# Patient Record
Sex: Female | Born: 1995 | Race: Black or African American | Hispanic: No | Marital: Single
Health system: Midwestern US, Community
[De-identification: ages and names within clinical notes are randomized; demographics above are authoritative.]

## PROBLEM LIST (undated history)

## (undated) DIAGNOSIS — M940 Chondrocostal junction syndrome [Tietze]: Secondary | ICD-10-CM

## (undated) DIAGNOSIS — E0590 Thyrotoxicosis, unspecified without thyrotoxic crisis or storm: Secondary | ICD-10-CM

## (undated) HISTORY — DX: Thyrotoxicosis, unspecified without thyrotoxic crisis or storm: E05.90

## (undated) HISTORY — PX: APPENDECTOMY: SHX54

## (undated) HISTORY — DX: Chondrocostal junction syndrome (tietze): M94.0

---
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
## 2014-03-31 ENCOUNTER — Encounter: Payer: Self-pay | Admitting: Gynecology

## 2014-03-31 ENCOUNTER — Ambulatory Visit (INDEPENDENT_AMBULATORY_CARE_PROVIDER_SITE_OTHER): Payer: 59 | Admitting: Gynecology

## 2014-03-31 VITALS — BP 118/70

## 2014-03-31 DIAGNOSIS — N911 Secondary amenorrhea: Secondary | ICD-10-CM

## 2014-03-31 DIAGNOSIS — N912 Amenorrhea, unspecified: Secondary | ICD-10-CM

## 2014-03-31 DIAGNOSIS — N898 Other specified noninflammatory disorders of vagina: Secondary | ICD-10-CM

## 2014-03-31 DIAGNOSIS — Z113 Encounter for screening for infections with a predominantly sexual mode of transmission: Secondary | ICD-10-CM

## 2014-03-31 LAB — WET PREP FOR TRICH, YEAST, CLUE
Clue Cells Wet Prep HPF POC: NONE SEEN
Trich, Wet Prep: NONE SEEN
YEAST WET PREP: NONE SEEN

## 2014-03-31 LAB — PREGNANCY, URINE: Preg Test, Ur: NEGATIVE

## 2014-03-31 MED ORDER — NORGESTIMATE-ETH ESTRADIOL 0.25-35 MG-MCG PO TABS: 1.0000 | ORAL_TABLET | Freq: Every day | ORAL | Status: AC

## 2014-03-31 MED ORDER — MEDROXYPROGESTERONE ACETATE 10 MG PO TABS: ORAL_TABLET | ORAL | Status: DC

## 2014-03-31 MED ORDER — MEDROXYPROGESTERONE ACETATE 10 MG PO TABS: 10.0000 mg | ORAL_TABLET | Freq: Every day | ORAL | Status: DC

## 2014-03-31 MED ORDER — METRONIDAZOLE 500 MG PO TABS: 500.0000 mg | ORAL_TABLET | Freq: Two times a day (BID) | ORAL | Status: DC

## 2014-03-31 NOTE — Progress Notes (Signed)
   Patient is an 18 year old who presented to the office today with several complaints one being that she has not had a menstrual cycle since August. She is using condoms for contraception. Patient denies any nausea, vomiting, unusual headaches, or nipple discharge. Patient is a type I diabetic on insulin pump. She's been followed by her endocrinologist. Also patient is complaining of a slight discharge at times with no odor no pruritus. She did state that when she started school she dropped from 175 pounds 250 pounds within 2-3 week time frame. Patient had been on oral contraceptive pill in the past but it was a low dose and she experienced breakthrough bleeding and is interested in returning back to the oral contraceptive pill.  Exam: Abdomen: Soft nontender no rebound or guarding Pelvic exam: Bartholin urethra Skene was within normal limits Vagina: No lesions or discharge Cervix: No lesions or discharge (wet prep and GC Chlamydia culture obtained) Uterus: Anteverted normal size shape and consistency Adnexa: No palpable mass or tenderness Rectal exam: Deferred  Urine pregnancy test negative Pap rare WBCs few bacteria  Assessment/plan: Patient with secondary amenorrhea negative urine pregnancy test. We are going to check her TSH and prolactin today. She will be prescribed Provera 10 mg to take 1 by mouth daily for the next 5-10 days if the above tests are negative. When she does withdrawal on the second day she will begin taking Sprintec 28 day oral contraceptive pill. Risks benefits and pros and cons were discussed. She will also be prescribed Flagyl 500 mg take one by mouth twice a day for 7 days. GC and chlamydia culture pending at time of this dictation.

## 2014-03-31 NOTE — Patient Instructions (Addendum)
Secondary Amenorrhea  Secondary amenorrhea is the stopping of menstrual flow for 3 6 months in a female who has previously had periods. There are many possible causes. Most of these causes are not serious. Usually, treating the underlying problem causing the loss of menses will return your periods to normal. CAUSES  Some common and uncommon causes of not menstruating include:  Malnutrition.  Low blood sugar (hypoglycemia).  Polycystic ovary disease.  Stress or fear.  Breastfeeding.  Hormone imbalance.  Ovarian failure.  Medicines.  Extreme obesity.  Cystic fibrosis.  Low body weight or drastic weight reduction from any cause.  Early menopause.  Removal of ovaries or uterus.  Contraceptives.  Illness.  Long-term (chronic) illnesses.  Cushing syndrome.  Thyroid problems.  Birth control pills, patches, or vaginal rings for birth control. RISK FACTORS You may be at greater risk of secondary amenorrhea if:  You have a family history of this condition.  You have an eating disorder.  You do athletic training. DIAGNOSIS  A diagnosis is made by your health care provider taking a medical history and doing a physical exam. This will include a pelvic exam to check for problems with your reproductive organs. Pregnancy must be ruled out. Often, numerous blood tests are done to measure different hormones in the body. Urine testing may be done. Specialized exams (ultrasound, CT scan, MRI, or hysteroscopy) may have to be done as well as measuring the body mass index (BMI). TREATMENT  Treatment depends on the cause of the amenorrhea. If an eating disorder is present, this can be treated with an adequate diet and therapy. Chronic illnesses may improve with treatment of the illness. Amenorrhea may be corrected with medicines, lifestyle changes, or surgery. If the amenorrhea cannot be corrected, it is sometimes possible to create a false menstruation with medicines. HOME CARE  INSTRUCTIONS  Maintain a healthy diet.  Manage weight problems.  Exercise regularly but not excessively.  Get adequate sleep.  Manage stress.  Be aware of changes in your menstrual cycle. Keep a record of when your periods occur. Note the date your period starts, how long it lasts, and any problems. SEEK MEDICAL CARE IF: Your symptoms do not get better with treatment. Document Released: 11/27/2006 Document Revised: 06/18/2013 Document Reviewed: 04/03/2013 [facility] Patient Information 2014 [place], [place]. Oral Contraception Information Oral contraceptive pills (OCPs) are medicines taken to prevent pregnancy. OCPs work by preventing the ovaries from releasing eggs. The hormones in OCPs also cause the cervical mucus to thicken, preventing the sperm from entering the uterus. The hormones also cause the uterine lining to become thin, not allowing a fertilized egg to attach to the inside of the uterus. OCPs are highly effective when taken exactly as prescribed. However, OCPs do not prevent sexually transmitted diseases (STDs). Safe sex practices, such as using condoms along with the pill, can help prevent STDs.  Before taking the pill, you may have a physical exam and Pap test. Your health care provider may order blood tests. The health care provider will make sure you are a good candidate for oral contraception. Discuss with your health care provider the possible side effects of the OCP you may be prescribed. When starting an OCP, it can take 2 to 3 months for the body to adjust to the changes in hormone levels in your body.  TYPES OF ORAL CONTRACEPTION The combination pill This pill contains estrogen and progestin (synthetic progesterone) hormones. The combination pill comes in 21-day, 28-day, or 91-day packs. Some types of combination  pills are meant to be taken continuously (365-day pills). With 21-day packs, you do not take pills for 7 days after the last pill. With 28-day packs, the pill is  taken every day. The last 7 pills are without hormones. Certain types of pills have more than 21 hormone-containing pills. With 91-day packs, the first 84 pills contain both hormones, and the last 7 pills contain no hormones or contain estrogen only. The minipill This pill contains the progesterone hormone only. The pill is taken every day continuously. It is very important to take the pill at the same time each day. The minipill comes in packs of 28 pills. All 28 pills contain the hormone.  ADVANTAGES OF ORAL CONTRACEPTIVE PILLS Decreases premenstrual symptoms.  Treats menstrual period cramps.  Regulates the menstrual cycle.  Decreases a heavy menstrual flow.  May treatacne, depending on the type of pill.  Treats abnormal uterine bleeding.  Treats polycystic ovarian syndrome.  Treats endometriosis.  Can be used as emergency contraception.  THINGS THAT CAN MAKE ORAL CONTRACEPTIVE PILLS LESS EFFECTIVE OCPs can be less effective if:  You forget to take the pill at the same time every day.  You have a stomach or intestinal disease that lessens the absorption of the pill.  You take OCPs with other medicines that make OCPs less effective, such as antibiotics, certain HIV medicines, and some seizure medicines.  You take expired OCPs.  You forget to restart the pill on day 7, when using the packs of 21 pills.  RISKS ASSOCIATED WITH ORAL CONTRACEPTIVE PILLS  Oral contraceptive pills can sometimes cause side effects, such as: Headache. Nausea. Breast tenderness. Irregular bleeding or spotting. Combination pills are also associated with a small increased risk of: Blood clots. Heart attack. Stroke. Document Released: 01/06/2003 Document Revised: 08/06/2013 Document Reviewed: 04/06/2013 [facility] Patient Information 2014 [place], [place]. Bacterial Vaginosis Bacterial vaginosis is a vaginal infection that occurs when the normal balance of bacteria in the vagina is disrupted. It  results from an overgrowth of certain bacteria. This is the most common vaginal infection in women of childbearing age. Treatment is important to prevent complications, especially in pregnant women, as it can cause a premature delivery. CAUSES  Bacterial vaginosis is caused by an increase in harmful bacteria that are normally present in smaller amounts in the vagina. Several different kinds of bacteria can cause bacterial vaginosis. However, the reason that the condition develops is not fully understood. RISK FACTORS Certain activities or behaviors can put you at an increased risk of developing bacterial vaginosis, including: Having a new sex partner or multiple sex partners. Douching. Using an intrauterine device (IUD) for contraception. Women do not get bacterial vaginosis from toilet seats, bedding, swimming pools, or contact with objects around them. SIGNS AND SYMPTOMS  Some women with bacterial vaginosis have no signs or symptoms. Common symptoms include: Grey vaginal discharge. A fishlike odor with discharge, especially after sexual intercourse. Itching or burning of the vagina and vulva. Burning or pain with urination. DIAGNOSIS  Your health care provider will take a medical history and examine the vagina for signs of bacterial vaginosis. A sample of vaginal fluid may be taken. Your health care provider will look at this sample under a microscope to check for bacteria and abnormal cells. A vaginal pH test may also be done.  TREATMENT  Bacterial vaginosis may be treated with antibiotic medicines. These may be given in the form of a pill or a vaginal cream. A second round of antibiotics may be prescribed  if the condition comes back after treatment.  HOME CARE INSTRUCTIONS  Only take over-the-counter or prescription medicines as directed by your health care provider. If antibiotic medicine was prescribed, take it as directed. Make sure you finish it even if you start to feel better. Do not  have sex until treatment is completed. Tell all sexual partners that you have a vaginal infection. They should see their health care provider and be treated if they have problems, such as a mild rash or itching. Practice safe sex by using condoms and only having one sex partner. SEEK MEDICAL CARE IF:  Your symptoms are not improving after 3 days of treatment. You have increased discharge or pain. You have a fever. MAKE SURE YOU:  Understand these instructions. Will watch your condition. Will get help right away if you are not doing well or get worse. FOR MORE INFORMATION  Centers for Disease Control and Prevention, Division of STD Prevention: SolutionApps.co.za American Sexual Health Association (ASHA): www.ashastd.org  Document Released: 10/16/2005 Document Revised: 08/06/2013 Document Reviewed: 05/28/2013 [facility] Patient Information 2014 [place], [place].

## 2014-04-01 LAB — GC/CHLAMYDIA PROBE AMP
CT Probe RNA: NEGATIVE
GC Probe RNA: NEGATIVE

## 2014-04-06 ENCOUNTER — Other Ambulatory Visit: Payer: 59

## 2014-04-06 LAB — TSH: TSH: 3.168 u[IU]/mL (ref 0.350–4.500)

## 2014-04-07 LAB — PROLACTIN: Prolactin: 4.3 ng/mL

## 2014-04-22 ENCOUNTER — Telehealth: Payer: Self-pay | Admitting: *Deleted

## 2014-04-22 NOTE — Telephone Encounter (Signed)
Pt mother Clenton Paredele called asking if okay for pt to take ortho cyclen pills. Patient informed okay to take pill

## 2014-05-12 ENCOUNTER — Emergency Department (INDEPENDENT_AMBULATORY_CARE_PROVIDER_SITE_OTHER)
Admission: EM | Admit: 2014-05-12 | Discharge: 2014-05-12 | Disposition: A | Discharge status: Home / Self Care | Payer: 59 | Source: Home / Self Care | Attending: Family Medicine | Admitting: Family Medicine

## 2014-05-12 ENCOUNTER — Emergency Department (INDEPENDENT_AMBULATORY_CARE_PROVIDER_SITE_OTHER): Payer: 59

## 2014-05-12 ENCOUNTER — Telehealth: Payer: Self-pay | Admitting: *Deleted

## 2014-05-12 ENCOUNTER — Encounter (HOSPITAL_COMMUNITY): Payer: Self-pay | Admitting: Emergency Medicine

## 2014-05-12 DIAGNOSIS — K5900 Constipation, unspecified: Secondary | ICD-10-CM

## 2014-05-12 LAB — POCT URINALYSIS DIP (DEVICE)
Bilirubin Urine: NEGATIVE
Glucose, UA: >=1000 mg/dL — AB
Hgb urine dipstick: NEGATIVE
Ketones, ur: NEGATIVE mg/dL
LEUKOCYTES UA: NEGATIVE
Nitrite: NEGATIVE
PROTEIN: NEGATIVE mg/dL
SPECIFIC GRAVITY, URINE: 1.02 (ref 1.005–1.030)
Urobilinogen, UA: 0.2 mg/dL (ref 0.0–1.0)
pH: 6 (ref 5.0–8.0)

## 2014-05-12 LAB — GLUCOSE, CAPILLARY: Glucose-Capillary: 241 mg/dL — ABNORMAL HIGH (ref 70–99)

## 2014-05-12 LAB — POCT PREGNANCY, URINE: Preg Test, Ur: NEGATIVE

## 2014-05-12 MED ORDER — POLYETHYLENE GLYCOL 3350 17 G PO PACK: 17.0000 g | PACK | Freq: Every day | ORAL | Status: DC

## 2014-05-12 NOTE — ED Notes (Signed)
Reports a bloating feeling that started this am, c/o feeling the need to urinate, but little or no result with attempts to urinate.  History of uti

## 2014-05-12 NOTE — Discharge Instructions (Signed)
If symptoms become suddenly worse or severe, please seek re-evaluation at her nearest ER. Otherwise, follow up with your primary care doctor if no improvement over the next several days. Bloating Bloating is the feeling of fullness in your belly. You may feel as though your pants are too tight. Often the cause of bloating is overeating, retaining fluids, or having gas in your bowel. It is also caused by swallowing air and eating foods that cause gas. Irritable bowel syndrome is one of the most common causes of bloating. Constipation is also a common cause. Sometimes more serious problems can cause bloating. SYMPTOMS  Usually there is a feeling of fullness, as though your abdomen is bulged out. There may be mild discomfort.  DIAGNOSIS  Usually no particular testing is necessary for most bloating. If the condition persists and seems to become worse, your caregiver may do additional testing.  TREATMENT   There is no direct treatment for bloating.  Do not put gas into the bowel. Avoid chewing gum and sucking on candy. These tend to make you swallow air. Swallowing air can also be a nervous habit. Try to avoid this.  Avoiding high residue diets will help. Eat foods with soluble fibers (examples include root vegetables, apples, or barley) and substitute dairy products with soy and rice products. This helps irritable bowel syndrome.  If constipation is the cause, then a high residue diet with more fiber will help.  Avoid carbonated beverages.  Over-the-counter preparations are available that help reduce gas. Your pharmacist can help you with this. SEEK MEDICAL CARE IF:   Bloating continues and seems to be getting worse.  You notice a weight gain.  You have a weight loss but the bloating is getting worse.  You have changes in your bowel habits or develop nausea or vomiting. SEEK IMMEDIATE MEDICAL CARE IF:   You develop shortness of breath or swelling in your legs.  You have an increase in  abdominal pain or develop chest pain. Document Released: 08/16/2006 Document Revised: 01/08/2012 Document Reviewed: 10/04/2007 [facility] Patient Information 2015 [place], [place]. This information is not intended to replace advice given to you by your health care provider. Make sure you discuss any questions you have with your health care provider.  Constipation Constipation is when a person:  Poops (has a bowel movement) less than 3 times a week.  Has a hard time pooping.  Has poop that is dry, hard, or bigger than normal. HOME CARE   Eat foods with a lot of fiber in them. This includes fruits, vegetables, beans, and whole grains such as brown rice.  Avoid fatty foods and foods with a lot of sugar. This includes french fries, hamburgers, cookies, candy, and soda.  If you are not getting enough fiber from food, take products with added fiber in them (supplements).  Drink enough fluid to keep your pee (urine) clear or pale yellow.  Exercise on a regular basis, or as told by your doctor.  Go to the restroom when you feel like you need to poop. Do not hold it.  Only take medicine as told by your doctor. Do not take medicines that help you poop (laxatives) without talking to your doctor first. GET HELP RIGHT AWAY IF:   You have bright red blood in your poop (stool).  Your constipation lasts more than 4 days or gets worse.  You have belly (abdominal) or butt (rectal) pain.  You have thin poop (as thin as a pencil).  You lose weight, and  it cannot be explained. MAKE SURE YOU:   Understand these instructions.  Will watch your condition.  Will get help right away if you are not doing well or get worse. Document Released: 04/03/2008 Document Revised: 10/21/2013 Document Reviewed: 07/28/2013 [facility] Patient Information 2015 [place], [place]. This information is not intended to replace advice given to you by your health care provider. Make sure you discuss any questions you have with  your health care provider.  Fiber Content in Foods Drinking plenty of fluids and consuming foods high in fiber can help with constipation. See the list below for the fiber content of some common foods. Starches and Grains / Dietary Fiber (g)  Cheerios, 1 cup / 3 g  Kellogg's Corn Flakes, 1 cup / 0.7 g  Rice Krispies, 1  cup / 0.3 g  Quaker Oat Life Cereal,  cup / 2.1 g  Oatmeal, instant (cooked),  cup / 2 g  Kellogg's Frosted Mini Wheats, 1 cup / 5.1 g  Rice, brown, long-grain (cooked), 1 cup / 3.5 g  Rice, white, long-grain (cooked), 1 cup / 0.6 g  Macaroni, cooked, enriched, 1 cup / 2.5 g Legumes / Dietary Fiber (g)  Beans, baked, canned, plain or vegetarian,  cup / 5.2 g  Beans, kidney, canned,  cup / 6.8 g  Beans, pinto, dried (cooked),  cup / 7.7 g  Beans, pinto, canned,  cup / 5.5 g Breads and Crackers / Dietary Fiber (g)  Graham crackers, plain or honey, 2 squares / 0.7 g  Saltine crackers, 3 squares / 0.3 g  Pretzels, plain, salted, 10 pieces / 1.8 g  Bread, whole-wheat, 1 slice / 1.9 g  Bread, white, 1 slice / 0.7 g  Bread, raisin, 1 slice / 1.2 g  Bagel, plain, 3 oz / 2 g  Tortilla, flour, 1 oz / 0.9 g  Tortilla, corn, 1 small / 1.5 g  Bun, hamburger or hotdog, 1 small / 0.9 g Fruits / Dietary Fiber (g)  Apple, raw with skin, 1 medium / 4.4 g  Applesauce, sweetened,  cup / 1.5 g  Banana,  medium / 1.5 g  Grapes, 10 grapes / 0.4 g  Orange, 1 small / 2.3 g  Raisin, 1.5 oz / 1.6 g  Melon, 1 cup / 1.4 g Vegetables / Dietary Fiber (g)  Green beans, canned,  cup / 1.3 g  Carrots (cooked),  cup / 2.3 g  Broccoli (cooked),  cup / 2.8 g  Peas, frozen (cooked),  cup / 4.4 g  Potatoes, mashed,  cup / 1.6 g  Lettuce, 1 cup / 0.5 g  Corn, canned,  cup / 1.6 g  Tomato,  cup / 1.1 g Document Released: 03/04/2007 Document Revised: 01/08/2012 Document Reviewed: 04/29/2007 [employer] Patient Information 2015 [place], [place].  This information is not intended to replace advice given to you by your health care provider. Make sure you discuss any questions you have with your health care provider.

## 2014-05-12 NOTE — Telephone Encounter (Signed)
Pt called c/o abdominal pain, transferred to front desk.

## 2014-05-12 NOTE — ED Provider Notes (Signed)
CSN: [id number]     Arrival date & time 05/12/14  1031 History   First MD Initiated Contact with Patient 05/12/14 1155     Chief Complaint  Patient presents with  . Vaginal Pain   (Consider location/radiation/quality/duration/timing/severity/associated sxs/prior Treatment) HPI Comments: Patient states she woke this morning with a sense of abdominal bloating and cramping with feeling the need to urinate. State she has had urinary freqeuncy and urgency since but is only able to urinate in small amounts. Denies fever, chills, dysuria, hematuria, vaginal bleeding, vaginal discharge, flank pain, constipation, nausea, vomiting, or diarrhea. States symptoms wax and wane.  No previous abdominal surgeries Has been Type 1 diabetic since age 89. Wears insulin pump and reports CBGs to have been under good control over past several days.  LNMP: end of June 2015   The history is provided by the patient.    Past Medical History  Diagnosis Date  . Diabetes mellitus     since age 89  . Hyperthyroidism   . Costochondritis    Past Surgical History  Procedure Laterality Date  . Appendectomy     Family History  Problem Relation Age of Onset  . Diabetes Other     Grandparent  . Stroke Other     Grandparent  . Hypertension Other     Grandparent   History  Substance Use Topics  . Smoking status: Never Smoker   . Smokeless tobacco: Never Used  . Alcohol Use: No   OB History   Grav Para Term Preterm Abortions TAB SAB Ect Mult Living   0              Review of Systems  Constitutional: Negative for fever, chills, appetite change and fatigue.  HENT: Negative.   Eyes: Negative for visual disturbance.  Respiratory: Negative.   Cardiovascular: Negative.   Gastrointestinal:       See HPI  Endocrine: Negative for polydipsia, polyphagia and polyuria.  Genitourinary: Positive for urgency, frequency, decreased urine volume and pelvic pain. Negative for dysuria, hematuria, flank pain, vaginal  bleeding, vaginal discharge, genital sores, vaginal pain and menstrual problem.  Musculoskeletal: Negative for back pain, gait problem and myalgias.  Neurological: Negative.     Allergies  Review of patient's allergies indicates no known allergies.  Home Medications   Prior to Admission medications   Medication Sig Start Date End Date Taking? Authorizing Provider  BAYER MICROLET LANCETS lancets Test 4 times daily as instructed 06/26/13   Romero BellingSean Ellison, MD  fluconazole (DIFLUCAN) 150 MG tablet Take 1 tablet (150 mg total) by mouth once. 10/14/13   Ok EdwardsJuan H Fernandez, MD  glucose blood (BAYER CONTOUR TEST) test strip 1 each by Other route 4 (four) times daily. And lancets 4/day 250.01 06/16/13   Romero BellingSean Ellison, MD  Insulin Infusion Pump Supplies (PARADIGM QUICK-SET 18" 6MM) MISC by Does not apply route. Change every 3 days     Historical Provider, MD  Insulin Infusion Pump Supplies (PARADIGM RESERVOIR 3ML) MISC by Does not apply route. Change every 3 days     Historical Provider, MD  insulin lispro (HUMALOG) 100 UNIT/ML injection For use in pump, total of 75 units per day 01/28/13   Romero BellingSean Ellison, MD  medroxyPROGESTERone (PROVERA) 10 MG tablet Take 1 tablet (10 mg total) by mouth daily. 03/31/14   Ok EdwardsJuan H Fernandez, MD  metroNIDAZOLE (FLAGYL) 500 MG tablet Take 1 tablet (500 mg total) by mouth 2 (two) times daily. 03/31/14   Ok EdwardsJuan H Fernandez, MD  norethindrone (  MICRONOR,CAMILA,ERRIN) 0.35 MG tablet Take 1 tablet (0.35 mg total) by mouth daily. 06/16/13   Ok Edwards, MD  norgestimate-ethinyl estradiol (ORTHO-CYCLEN,SPRINTEC,PREVIFEM) 0.25-35 MG-MCG tablet Take 1 tablet by mouth daily. 03/31/14   Ok Edwards, MD  polyethylene glycol [facility] / Ethelene Hal) packet Take 17 g by mouth daily. Mix with 8 oz of water and drink once a day x 5 days 05/12/14   Jess Barters Dusty Wagoner, PA   BP 110/69  Pulse 66  Temp(Src) 98.3 F (36.8 C) (Oral)  Resp 16  SpO2 100%  LMP 04/25/2014 Physical Exam  Nursing note and  vitals reviewed. Constitutional: She is oriented to person, place, and time. She appears well-developed and well-nourished.  HENT:  Head: Normocephalic and atraumatic.  Eyes: Conjunctivae are normal. No scleral icterus.  Cardiovascular: Normal rate, regular rhythm and normal heart sounds.   Pulmonary/Chest: Effort normal and breath sounds normal.  Abdominal: Soft. Normal appearance and bowel sounds are normal. She exhibits no distension and no mass. There is no hepatosplenomegaly. There is tenderness in the right lower quadrant, epigastric area, suprapubic area and left lower quadrant. There is no rigidity, no rebound, no guarding, no CVA tenderness, no tenderness at McBurney's point and negative Murphy's sign. No hernia.  Tenderness is mild  Musculoskeletal: Normal range of motion.  Neurological: She is alert and oriented to person, place, and time.  Skin: Skin is warm and dry. No rash noted. No erythema.  Psychiatric: She has a normal mood and affect. Her behavior is normal.    ED Course  Procedures (including critical care time) Labs Review Labs Reviewed  GLUCOSE, CAPILLARY - Abnormal; Notable for the following:    Glucose-Capillary 241 (*)    All other components within normal limits  POCT URINALYSIS DIP (DEVICE) - Abnormal; Notable for the following:    Glucose, UA >=1000 (*)    All other components within normal limits  POCT PREGNANCY, URINE    Imaging Review Dg Abd 2 Views  05/12/2014   CLINICAL DATA:  Lower abdominal pain  EXAM: ABDOMEN - 2 VIEW  COMPARISON:  Prior CT abdomen/ pelvis 12/05/2013  FINDINGS: Linear metallic foreign body projecting over the region of the ulna like is consistent with known insulin pump device. There is no evidence of obstruction, free fluid, free air or suspicious calcification. The colonic stool burden is moderate time suggesting an element of constipation. The lung bases are clear. The visualized cardiac structures are unremarkable. No acute  osseous abnormality.  IMPRESSION: 1. Moderate to large colonic stool burden suggests an element of underlying constipation. 2. Otherwise, unremarkable.   Electronically Signed   By: Malachy Moan M.D.   On: 05/12/2014 12:41     MDM   1. Constipation, unspecified constipation type   UPT negative. UA only notable for glucosuria with CBG of 241 Abdominal films consistent with constipation. Results reviewed with patient and at the time of review and re-evaluation, patient reports herself to be asymptomatic. Will treat for constipation and advise close follow up with her PCP if symptoms do not improve. She voices understanding of the need to report to her nearest ER for re-evaluation if symptoms become suddenly worse, severe or persistent.     Jess Barters [place], [place] 05/12/14 1308

## 2014-05-13 ENCOUNTER — Ambulatory Visit: Payer: 59 | Admitting: Women's Health

## 2014-05-13 NOTE — ED Provider Notes (Signed)
Medical screening examination/treatment/procedure(s) were performed by resident physician or non-physician practitioner and as supervising physician I was immediately available for consultation/collaboration.   Philomena Buttermore DOUGLAS MD.   Nyasia Baxley D Joretta Eads, MD 05/13/14 1424 

## 2014-05-20 ENCOUNTER — Ambulatory Visit (INDEPENDENT_AMBULATORY_CARE_PROVIDER_SITE_OTHER): Payer: 59 | Admitting: Women's Health

## 2014-05-20 DIAGNOSIS — B373 Candidiasis of vulva and vagina: Secondary | ICD-10-CM

## 2014-05-20 DIAGNOSIS — B3731 Acute candidiasis of vulva and vagina: Secondary | ICD-10-CM

## 2014-05-20 DIAGNOSIS — Z113 Encounter for screening for infections with a predominantly sexual mode of transmission: Secondary | ICD-10-CM

## 2014-05-20 DIAGNOSIS — E1065 Type 1 diabetes mellitus with hyperglycemia: Secondary | ICD-10-CM

## 2014-05-20 DIAGNOSIS — E109 Type 1 diabetes mellitus without complications: Secondary | ICD-10-CM

## 2014-05-20 LAB — WET PREP FOR TRICH, YEAST, CLUE
CLUE CELLS WET PREP: NONE SEEN
Trich, Wet Prep: NONE SEEN
WBC, Wet Prep HPF POC: NONE SEEN

## 2014-05-20 LAB — HEMOGLOBIN A1C
Hgb A1c MFr Bld: 10.6 % — ABNORMAL HIGH (ref <5.7)
Mean Plasma Glucose: 258 mg/dL — ABNORMAL HIGH (ref <117)

## 2014-05-20 LAB — RPR

## 2014-05-20 MED ORDER — FLUCONAZOLE 150 MG PO TABS: 150.0000 mg | ORAL_TABLET | Freq: Once | ORAL | Status: DC

## 2014-05-20 NOTE — Progress Notes (Signed)
Patient ID: Cheryl FrancoisCherie D [name], female   DOB: 11/21/1995, 18 y.o.   MRN: [id number] Presents with several complaints. States has had breast tenderness off and on for about 6 months, no change in exam. Also has a lump in pubic area for months. Type I diabetic on an insulin pump. Monthly cycle on Sprintec. Was seen at the hospital for low abdominal pain/cramping 2 weeks ago, which is better. Negative GC/Chlamydia and was diagnosed with constipation although she does not feel she is. Denies fever.  Exam: Appears well. Breast exam in sitting and lying position without palpable nodules, retractions, nipple discharge. Abdomen soft without rebound or radiation of pain. 2 cm area of folliculitis nonindurated at mons pubis. External genitalia slight erythema, speculum exam scant white discharge wet prep positive for yeast. Bimanual no CMT or adnexal fullness or tenderness.  Yeast vaginitis Mastodynia Folliculitis Type 1 diabetes on pump  Plan: Diflucan 150 by mouth times one days prescription, proper use given and reviewed. Reviewed importance of diabetes control in relationship to  yeast. Vitamin E twice daily, avoid caffeinated beverages. Loose clothing, warm soaks, antibiotic ointment to the area of folliculitis twice daily. Condoms  encouraged until permanent partner, instructed to call if no relief of symptoms. Hemoglobin A1c, keep scheduled followup in September with endocrinologist.

## 2014-05-20 NOTE — Patient Instructions (Signed)

## 2014-05-21 LAB — HIV ANTIBODY (ROUTINE TESTING W REFLEX): HIV 1&2 Ab, 4th Generation: NONREACTIVE

## 2014-05-21 LAB — HEPATITIS B SURFACE ANTIGEN: Hepatitis B Surface Ag: NEGATIVE

## 2014-05-21 LAB — HEPATITIS C ANTIBODY: HCV Ab: NEGATIVE

## 2014-05-28 ENCOUNTER — Telehealth: Payer: Self-pay

## 2014-05-28 NOTE — Telephone Encounter (Signed)
Diabetic Bundle. Pt no longer under Dr. Ellison's care.  

## 2014-08-12 ENCOUNTER — Ambulatory Visit (INDEPENDENT_AMBULATORY_CARE_PROVIDER_SITE_OTHER): Payer: 59 | Admitting: Physician Assistant

## 2014-08-12 VITALS — BP 122/74 | HR 95 | Temp 98.0°F | Resp 17 | Ht 71.0 in | Wt 158.0 lb

## 2014-08-12 DIAGNOSIS — Z23 Encounter for immunization: Secondary | ICD-10-CM

## 2014-08-12 DIAGNOSIS — G43009 Migraine without aura, not intractable, without status migrainosus: Secondary | ICD-10-CM

## 2014-08-12 MED ORDER — SUMATRIPTAN SUCCINATE 100 MG PO TABS: 100.0000 mg | ORAL_TABLET | ORAL | Status: AC | PRN

## 2014-08-12 NOTE — Progress Notes (Addendum)
   Subjective:    Patient ID: Cheryl [name], female    DOB: 01/12/1996, 18 y.o.   MRN: [id number]  HPI Patient with PMH of type I diabetes and migraines presents to clinic for a migraine that started this morning when she woke up. Pain sas 8/10, throbbing all over pain that did not radiate that was accompained by photo- and audiophobia. It is now 2/10 after sister gave her some medication that she is unsure of. Denies aura, nausea, or vomitting. Did miss school today. She saw a neurologist 2 1/2 years ago for migraines and was given Relpax. Didn't work for her after taking for 4 months and she stopped seeing neuro.   Health Maintenance: Request flu vaccine.   Review of Systems  Constitutional: Negative for fever, activity change and appetite change.  HENT: Negative for dental problem, ear pain, sinus pressure and tinnitus.   Eyes: Positive for photophobia. Negative for pain, discharge and visual disturbance.  Respiratory: Negative for cough and shortness of breath.   Cardiovascular: Negative for chest pain, palpitations and leg swelling.  Gastrointestinal: Negative for nausea and vomiting.  Neurological: Positive for headaches. Negative for dizziness, seizures, weakness and light-headedness.       Objective:   Physical Exam  Constitutional: She is oriented to person, place, and time. She appears well-developed and well-nourished. No distress.  Blood pressure 122/74, pulse 95, temperature 98 F (36.7 C), temperature source Oral, resp. rate 17, height 5\' 11"  (1.803 m), weight 158 lb (71.668 kg), last menstrual period 08/05/2014, SpO2 97.00%.   HENT:  Head: Normocephalic and atraumatic.  Right Ear: External ear normal.  Left Ear: External ear normal.  Mouth/Throat: Oropharynx is clear and moist.  Eyes: Conjunctivae and EOM are normal. Pupils are equal, round, and reactive to light. Right eye exhibits no discharge. Left eye exhibits no discharge.  Neck: Neck supple. No thyromegaly  present.  Cardiovascular: Normal rate, regular rhythm and normal heart sounds.  Exam reveals no gallop and no friction rub.   No murmur heard. Pulmonary/Chest: Effort normal and breath sounds normal. She has no wheezes. She has no rales.  Abdominal: Soft. There is no tenderness.  Musculoskeletal: Normal range of motion. She exhibits no edema and no tenderness.  Lymphadenopathy:    She has no cervical adenopathy.  Neurological: She is alert and oriented to person, place, and time. No cranial nerve deficit.  Skin: Skin is warm and dry. No rash noted. She is not diaphoretic. No erythema. No pallor.  Psychiatric: She has a normal mood and affect. Her behavior is normal. Judgment and thought content normal.       Assessment & Plan:  1. Migraine without aura and without status migrainosus, not intractable If no improvement or more frequent migraines, will refer to neurology for more comprehensive management. - SUMAtriptan (IMITREX) 100 MG tablet; Take 1 tablet (100 mg total) by mouth every 2 (two) hours as needed for migraine or headache. May repeat in 2 hours if headache persists or recurs.  Dispense: 10 tablet; Refill: 0 - Note written to return to school 08/13/14. - Patient declines further treatment for this migraine.  2. Need for influenza vaccination - Flu Vaccine QUAD 36+ mos IM  Zenovia Justman PA-C  [facility] 08/12/2014 4:34 PM

## 2014-08-13 NOTE — Progress Notes (Signed)
I have discussed this patient with Ms. Brewington, PA-C and agree.  

## 2014-08-23 ENCOUNTER — Emergency Department (HOSPITAL_COMMUNITY)
Admission: EM | Admit: 2014-08-23 | Discharge: 2014-08-23 | Disposition: A | Discharge status: Home / Self Care | Payer: 59 | Source: Home / Self Care | Attending: Family Medicine | Admitting: Family Medicine

## 2014-08-23 ENCOUNTER — Encounter (HOSPITAL_COMMUNITY): Payer: Self-pay | Admitting: Emergency Medicine

## 2014-08-23 DIAGNOSIS — H109 Unspecified conjunctivitis: Secondary | ICD-10-CM

## 2014-08-23 MED ORDER — TOBRAMYCIN 0.3 % OP SOLN: 1.0000 [drp] | OPHTHALMIC | Status: DC

## 2014-08-23 NOTE — ED Provider Notes (Signed)
CSN: [id number]     Arrival date & time 08/23/14  1253 History   First MD Initiated Contact with Patient 08/23/14 1312     Chief Complaint  Patient presents with  . Conjunctivitis   (Consider location/radiation/quality/duration/timing/severity/associated sxs/prior Treatment) Patient is a 18 y.o. female presenting with conjunctivitis. The history is provided by the patient.  Conjunctivitis This is a new problem. The current episode started 3 to 5 hours ago (awoke with left eye redness today.eyelids stuck, wears contacts, changes daily.). The problem has not changed since onset.   Past Medical History  Diagnosis Date  . Diabetes mellitus     since age 89  . Hyperthyroidism   . Costochondritis    Past Surgical History  Procedure Laterality Date  . Appendectomy     Family History  Problem Relation Age of Onset  . Diabetes Other     Grandparent  . Stroke Other     Grandparent  . Hypertension Other     Grandparent   History  Substance Use Topics  . Smoking status: Never Smoker   . Smokeless tobacco: Never Used  . Alcohol Use: No   OB History   Grav Para Term Preterm Abortions TAB SAB Ect Mult Living   0              Review of Systems  Constitutional: Negative.   HENT: Negative.   Eyes: Positive for discharge and redness. Negative for photophobia, pain, itching and visual disturbance.    Allergies  Review of patient's allergies indicates no known allergies.  Home Medications   Prior to Admission medications   Medication Sig Start Date End Date Taking? Authorizing Provider  insulin glargine (LANTUS) 100 UNIT/ML injection Inject 46 Units into the skin at bedtime.   Yes Historical Provider, MD  BAYER MICROLET LANCETS lancets Test 4 times daily as instructed 06/26/13   Romero BellingSean Ellison, MD  fluconazole (DIFLUCAN) 150 MG tablet Take 1 tablet (150 mg total) by mouth once. 05/20/14   Harrington ChallengerNancy J Young, NP  glucose blood (BAYER CONTOUR TEST) test strip 1 each by Other route 4  (four) times daily. And lancets 4/day 250.01 06/16/13   Romero BellingSean Ellison, MD  Insulin Infusion Pump Supplies (PARADIGM QUICK-SET 18" 6MM) MISC by Does not apply route. Change every 3 days     Historical Provider, MD  Insulin Infusion Pump Supplies (PARADIGM RESERVOIR 3ML) MISC by Does not apply route. Change every 3 days     Historical Provider, MD  insulin lispro (HUMALOG) 100 UNIT/ML injection For use in pump, total of 75 units per day 01/28/13   Romero BellingSean Ellison, MD  medroxyPROGESTERone (PROVERA) 10 MG tablet Take 1 tablet (10 mg total) by mouth daily. 03/31/14   Ok EdwardsJuan H Fernandez, MD  metroNIDAZOLE (FLAGYL) 500 MG tablet Take 1 tablet (500 mg total) by mouth 2 (two) times daily. 03/31/14   Ok EdwardsJuan H Fernandez, MD  norethindrone (MICRONOR,CAMILA,ERRIN) 0.35 MG tablet Take 1 tablet (0.35 mg total) by mouth daily. 06/16/13   Ok EdwardsJuan H Fernandez, MD  norgestimate-ethinyl estradiol (ORTHO-CYCLEN,SPRINTEC,PREVIFEM) 0.25-35 MG-MCG tablet Take 1 tablet by mouth daily. 03/31/14   Ok EdwardsJuan H Fernandez, MD  polyethylene glycol [facility](MIRALAX / Ethelene HalGLYCOLAX) packet Take 17 g by mouth daily. Mix with 8 oz of water and drink once a day x 5 days 05/12/14   Ria ClockJennifer Lee H Presson, PA  SUMAtriptan (IMITREX) 100 MG tablet Take 1 tablet (100 mg total) by mouth every 2 (two) hours as needed for migraine or headache. May repeat in  2 hours if headache persists or recurs. 08/12/14   Tishira R Brewington, PA-C  tobramycin (TOBREX) 0.3 % ophthalmic solution Place 1 drop into the left eye every 4 (four) hours. 08/23/14   Linna HoffJames D Linnie Mcglocklin, MD   BP 106/75  Pulse 80  Temp(Src) 98.2 F (36.8 C) (Oral)  Resp 16  SpO2 97%  LMP 08/05/2014 Physical Exam  Nursing note and vitals reviewed. Constitutional: She appears well-developed and well-nourished. No distress.  Eyes: EOM and lids are normal. Pupils are equal, round, and reactive to light. Lids are everted and swept, no foreign bodies found. Left eye exhibits no discharge and no exudate. Left conjunctiva is  injected. Left conjunctiva has no hemorrhage.    ED Course  Procedures (including critical care time) Labs Review Labs Reviewed - No data to display  Imaging Review No results found.   MDM   1. Conjunctivitis of left eye       Linna HoffJames D Buffey Zabinski, MD 08/23/14 1342

## 2014-08-23 NOTE — ED Notes (Signed)
Reports irritation, redness, drainage and mild pain in the left eye.  On set today.  Denies fever and any other symptoms

## 2014-08-23 NOTE — Discharge Instructions (Signed)
Cool cloth to eye before eye drop.

## 2014-09-01 ENCOUNTER — Encounter: Payer: Self-pay | Admitting: Gynecology

## 2014-09-01 ENCOUNTER — Ambulatory Visit (INDEPENDENT_AMBULATORY_CARE_PROVIDER_SITE_OTHER): Payer: 59 | Admitting: Gynecology

## 2014-09-01 VITALS — BP 132/84

## 2014-09-01 DIAGNOSIS — N949 Unspecified condition associated with female genital organs and menstrual cycle: Secondary | ICD-10-CM

## 2014-09-01 DIAGNOSIS — R300 Dysuria: Secondary | ICD-10-CM

## 2014-09-01 DIAGNOSIS — B3731 Acute candidiasis of vulva and vagina: Secondary | ICD-10-CM

## 2014-09-01 DIAGNOSIS — B373 Candidiasis of vulva and vagina: Secondary | ICD-10-CM

## 2014-09-01 LAB — URINALYSIS W MICROSCOPIC + REFLEX CULTURE
Bilirubin Urine: NEGATIVE
CASTS: NONE SEEN
Crystals: NONE SEEN
Glucose, UA: >1000 mg/dL — AB
Ketones, ur: NEGATIVE mg/dL
Nitrite: NEGATIVE
Protein, ur: NEGATIVE mg/dL
Specific Gravity, Urine: <1.005 — ABNORMAL LOW (ref 1.005–1.030)
Urobilinogen, UA: 0.2 mg/dL (ref 0.0–1.0)
pH: 6.5 (ref 5.0–8.0)

## 2014-09-01 LAB — WET PREP FOR TRICH, YEAST, CLUE
Clue Cells Wet Prep HPF POC: NONE SEEN
Trich, Wet Prep: NONE SEEN

## 2014-09-01 MED ORDER — FLUCONAZOLE 150 MG PO TABS: 150.0000 mg | ORAL_TABLET | Freq: Once | ORAL | Status: DC

## 2014-09-01 MED ORDER — PHENAZOPYRIDINE HCL 200 MG PO TABS: 200.0000 mg | ORAL_TABLET | Freq: Three times a day (TID) | ORAL | Status: DC | PRN

## 2014-09-01 NOTE — Progress Notes (Signed)
   18 year old who presented to the office today for the past today she was having vulvar pruritus and a slight discharge which was white with minimal odor. Patient had intercourse this morning. Patient is on the oral contraceptive pill for contraception. Review of her record indicated that in July of this year she had a full STD workup. This test were negative. She has had no change in sexual partner. She's having some mild dysuria. Patient denies any CVA tenderness, fever, chills, nausea, or vomiting.  Exam: Bartholin urethra Skene glands within normal limits slightly reddened actual genitalia from pruritus Vagina: White discharge was noted no odor Cervix: No lesions or discharge Uterus: Anteverted normal size shape and consistency Adnexa: No palpable mass or tenderness Rectal exam: Not done  Urinalysis 3-6 WBC, few bacteria, yeast Wet prep: Few yeast were noted  Assessment/plan: Yeast vaginitis will be prescribed Diflucan 150 mg take 1 by mouth today. 2 refills were provided. Recent and STD screening negative. We will wait for the result of the urine culture. Patient will also be given a prescription of pyridium 200 mg to take 1 by mouth 3 times a day for the next 3 days as we wait for the urine culture. I have recommended the patient by over-the-counter refresh probiotic tablet to take 1 daily or refresh vaginal to apply twice a week or after intercourse and/or after menses.

## 2014-09-01 NOTE — Patient Instructions (Signed)

## 2014-09-02 LAB — URINE CULTURE

## 2014-09-03 ENCOUNTER — Ambulatory Visit: Payer: 59 | Admitting: Gynecology

## 2014-09-23 ENCOUNTER — Ambulatory Visit (INDEPENDENT_AMBULATORY_CARE_PROVIDER_SITE_OTHER): Payer: 59 | Admitting: Women's Health

## 2014-09-23 ENCOUNTER — Telehealth: Payer: Self-pay

## 2014-09-23 VITALS — BP 120/80 | Ht 72.0 in | Wt 162.0 lb

## 2014-09-23 DIAGNOSIS — R350 Frequency of micturition: Secondary | ICD-10-CM

## 2014-09-23 DIAGNOSIS — B3731 Acute candidiasis of vulva and vagina: Secondary | ICD-10-CM

## 2014-09-23 DIAGNOSIS — B373 Candidiasis of vulva and vagina: Secondary | ICD-10-CM

## 2014-09-23 LAB — URINALYSIS W MICROSCOPIC + REFLEX CULTURE
Bilirubin Urine: NEGATIVE
CASTS: NONE SEEN
Crystals: NONE SEEN
Ketones, ur: NEGATIVE mg/dL
LEUKOCYTES UA: NEGATIVE
Nitrite: NEGATIVE
PH: 6 (ref 5.0–8.0)
Protein, ur: NEGATIVE mg/dL
Specific Gravity, Urine: <1.005 — ABNORMAL LOW (ref 1.005–1.030)
Urobilinogen, UA: 0.2 mg/dL (ref 0.0–1.0)

## 2014-09-23 LAB — WET PREP FOR TRICH, YEAST, CLUE
Clue Cells Wet Prep HPF POC: NONE SEEN
Trich, Wet Prep: NONE SEEN

## 2014-09-23 MED ORDER — FLUCONAZOLE 100 MG PO TABS: ORAL_TABLET | ORAL | Status: DC

## 2014-09-23 MED ORDER — NYSTATIN-TRIAMCINOLONE 100000-0.1 UNIT/GM-% EX OINT: 1.0000 "application " | TOPICAL_OINTMENT | Freq: Two times a day (BID) | CUTANEOUS | Status: AC

## 2014-09-23 MED ORDER — FLUCONAZOLE 100 MG PO TABS: ORAL_TABLET | ORAL | Status: AC

## 2014-09-23 NOTE — Addendum Note (Signed)
Addended by: Berna SpareASTILLO, April Carlyon A on: 09/23/2014 03:21 PM   Modules accepted: Orders

## 2014-09-23 NOTE — Patient Instructions (Signed)

## 2014-09-23 NOTE — Progress Notes (Signed)
Patient ID: Cheryl [name], female   DOB: 02/14/1996, 18 y.o.   MRN: [id number] Presents with complaint of vaginal itching, had been treated for yeast with Diflucan but has persistent vaginal itching. Monthly cycles/same partner. Type 1 diabetes since age 18, poor control has follow-up scheduled first week of December.  Exam: Appears well. External genitalia erythematous at introitus, speculum exam scant white discharge wet prep positive for yeast. Bimanual no CMT or adnexal fullness or tenderness.  Recurrent  Yeast vaginitis Type 1 diabetes poor control  Plan: Diflucan 200 mg by mouth today, repeat in 3 days, 100 mg weekly if needed. Reviewed importance of better diabetes control to help prevent recurrent yeast.

## 2014-09-23 NOTE — Telephone Encounter (Signed)
Pharmacy called to verify the quantity on the Diflucan you ordered.  Quantity and instructions must correlate for ins purposes.  " Dose, Route, Frequency: As Directed    Dispense Quantity:  30 tablet Refills:  0 Fills Remaining:  0          Sig: 2 tablets today and repeat in 3 days and 1 as needed         Written Date:  09/23/14 Expiration Date:  09/23/15     Start Date:  09/23/14 End Date:  --     Ordering Provider:  -- Authorizing Provider:  Harrington ChallengerNancy J Young, NP Ordering User:  Harrington ChallengerNancy J Young, NP          Diagnosis Association: Yeast vaginitis (112.1)

## 2014-09-27 NOTE — Telephone Encounter (Signed)
2 today, 2 in 3 days and 1 every 3 days as needed. ( Poor control of DM type 1)

## 2014-09-28 NOTE — Telephone Encounter (Signed)
Pharmacy notified.

## 2014-10-01 ENCOUNTER — Encounter: Payer: 59 | Admitting: Gynecology

## 2014-10-15 ENCOUNTER — Emergency Department (HOSPITAL_COMMUNITY): Payer: 59

## 2014-10-15 ENCOUNTER — Encounter (HOSPITAL_COMMUNITY): Payer: Self-pay | Admitting: Emergency Medicine

## 2014-10-15 ENCOUNTER — Emergency Department (HOSPITAL_COMMUNITY)
Admission: EM | Admit: 2014-10-15 | Discharge: 2014-10-16 | Disposition: A | Discharge status: Home / Self Care | Payer: 59 | Attending: Emergency Medicine | Admitting: Emergency Medicine

## 2014-10-15 DIAGNOSIS — Z3202 Encounter for pregnancy test, result negative: Secondary | ICD-10-CM | POA: Insufficient documentation

## 2014-10-15 DIAGNOSIS — N12 Tubulo-interstitial nephritis, not specified as acute or chronic: Secondary | ICD-10-CM

## 2014-10-15 DIAGNOSIS — R079 Chest pain, unspecified: Secondary | ICD-10-CM | POA: Diagnosis not present

## 2014-10-15 DIAGNOSIS — Z794 Long term (current) use of insulin: Secondary | ICD-10-CM | POA: Diagnosis not present

## 2014-10-15 DIAGNOSIS — N3090 Cystitis, unspecified without hematuria: Secondary | ICD-10-CM | POA: Diagnosis not present

## 2014-10-15 DIAGNOSIS — Z79899 Other long term (current) drug therapy: Secondary | ICD-10-CM | POA: Diagnosis not present

## 2014-10-15 DIAGNOSIS — M6281 Muscle weakness (generalized): Secondary | ICD-10-CM | POA: Diagnosis not present

## 2014-10-15 DIAGNOSIS — R110 Nausea: Secondary | ICD-10-CM | POA: Insufficient documentation

## 2014-10-15 DIAGNOSIS — E1165 Type 2 diabetes mellitus with hyperglycemia: Secondary | ICD-10-CM | POA: Diagnosis present

## 2014-10-15 DIAGNOSIS — Z8619 Personal history of other infectious and parasitic diseases: Secondary | ICD-10-CM | POA: Insufficient documentation

## 2014-10-15 DIAGNOSIS — Z8639 Personal history of other endocrine, nutritional and metabolic disease: Secondary | ICD-10-CM | POA: Diagnosis not present

## 2014-10-15 DIAGNOSIS — Z7952 Long term (current) use of systemic steroids: Secondary | ICD-10-CM | POA: Diagnosis not present

## 2014-10-15 LAB — COMPREHENSIVE METABOLIC PANEL
ALT: 20 U/L (ref 0–35)
AST: 29 U/L (ref 0–37)
Albumin: 3.7 g/dL (ref 3.5–5.2)
Alkaline Phosphatase: 83 U/L (ref 39–117)
Anion gap: 11 (ref 5–15)
BUN: 11 mg/dL (ref 6–23)
CO2: 26 meq/L (ref 19–32)
Calcium: 9.9 mg/dL (ref 8.4–10.5)
Chloride: 99 mEq/L (ref 96–112)
Creatinine, Ser: 0.88 mg/dL (ref 0.50–1.10)
GLUCOSE: 182 mg/dL — AB (ref 70–99)
POTASSIUM: 4.3 meq/L (ref 3.7–5.3)
SODIUM: 136 meq/L — AB (ref 137–147)
Total Bilirubin: <0.2 mg/dL — ABNORMAL LOW (ref 0.3–1.2)
Total Protein: 7.4 g/dL (ref 6.0–8.3)

## 2014-10-15 LAB — PREGNANCY, URINE: PREG TEST UR: NEGATIVE

## 2014-10-15 LAB — URINALYSIS, ROUTINE W REFLEX MICROSCOPIC
BILIRUBIN URINE: NEGATIVE
HGB URINE DIPSTICK: NEGATIVE
KETONES UR: NEGATIVE mg/dL
NITRITE: NEGATIVE
PH: 7 (ref 5.0–8.0)
Protein, ur: NEGATIVE mg/dL
Specific Gravity, Urine: 1.037 — ABNORMAL HIGH (ref 1.005–1.030)
Urobilinogen, UA: 1 mg/dL (ref 0.0–1.0)

## 2014-10-15 LAB — CBC WITH DIFFERENTIAL/PLATELET
Basophils Absolute: 0 10*3/uL (ref 0.0–0.1)
Basophils Relative: 1 % (ref 0–1)
EOS ABS: 0 10*3/uL (ref 0.0–0.7)
Eosinophils Relative: 1 % (ref 0–5)
HCT: 37.5 % (ref 36.0–46.0)
Hemoglobin: 13 g/dL (ref 12.0–15.0)
LYMPHS ABS: 1.6 10*3/uL (ref 0.7–4.0)
Lymphocytes Relative: 44 % (ref 12–46)
MCH: 28.3 pg (ref 26.0–34.0)
MCHC: 34.7 g/dL (ref 30.0–36.0)
MCV: 81.5 fL (ref 78.0–100.0)
MONO ABS: 0.4 10*3/uL (ref 0.1–1.0)
Monocytes Relative: 10 % (ref 3–12)
Neutro Abs: 1.5 10*3/uL — ABNORMAL LOW (ref 1.7–7.7)
Neutrophils Relative %: 44 % (ref 43–77)
PLATELETS: 199 10*3/uL (ref 150–400)
RBC: 4.6 MIL/uL (ref 3.87–5.11)
RDW: 11.7 % (ref 11.5–15.5)
WBC: 3.5 10*3/uL — ABNORMAL LOW (ref 4.0–10.5)

## 2014-10-15 LAB — URINE MICROSCOPIC-ADD ON

## 2014-10-15 LAB — LIPASE, BLOOD: Lipase: 31 U/L (ref 11–59)

## 2014-10-15 MED ORDER — SODIUM CHLORIDE 0.9 % IV BOLUS (SEPSIS)
1000.0000 mL | Freq: Once | INTRAVENOUS | Status: AC
  Administered 2014-10-15: 1000 mL via INTRAVENOUS

## 2014-10-15 MED ORDER — IOHEXOL 300 MG/ML  SOLN
25.0000 mL | Freq: Once | INTRAMUSCULAR | Status: AC | PRN
  Administered 2014-10-15: 25 mL via ORAL

## 2014-10-15 MED ORDER — IOHEXOL 300 MG/ML  SOLN
100.0000 mL | Freq: Once | INTRAMUSCULAR | Status: AC | PRN
  Administered 2014-10-15: 100 mL via INTRAVENOUS

## 2014-10-15 MED ORDER — DEXTROSE 5 % IV SOLN
1.0000 g | Freq: Once | INTRAVENOUS | Status: AC
  Administered 2014-10-15: 1 g via INTRAVENOUS
  Filled 2014-10-15: qty 10

## 2014-10-15 MED ORDER — ONDANSETRON HCL 4 MG/2ML IJ SOLN
4.0000 mg | Freq: Once | INTRAMUSCULAR | Status: AC
  Administered 2014-10-15: 4 mg via INTRAVENOUS
  Filled 2014-10-15: qty 2

## 2014-10-15 MED ORDER — MORPHINE SULFATE 2 MG/ML IJ SOLN
2.0000 mg | Freq: Once | INTRAMUSCULAR | Status: AC
  Administered 2014-10-15: 2 mg via INTRAVENOUS
  Filled 2014-10-15: qty 1

## 2014-10-15 NOTE — ED Notes (Signed)
Report feeling that her sugar has been high today, abd pain with nausea, no V/D/F, A/O X4, ambulatory and in NAD

## 2014-10-15 NOTE — ED Notes (Signed)
CBG 195 in triage

## 2014-10-15 NOTE — ED Notes (Signed)
Pt monitored by pulse ox, bp cuff, and 5-lead. 

## 2014-10-15 NOTE — ED Provider Notes (Signed)
CSN: [id number]     Arrival date & time 10/15/14  1942 History   First MD Initiated Contact with Patient 10/15/14 2025     Chief Complaint  Patient presents with  . Hyperglycemia     (Consider location/radiation/quality/duration/timing/severity/associated sxs/prior Treatment) HPI  18 year old female with insulin-dependent diabetes who comes in today stating that she began not feeling well this afternoon. She states that she felt like her blood sugars were high but did not take her blood sugars. She then went to work and began spasms. She has had chills and sweating. She has had nausea but no vomiting. She has not had any cough or dyspnea. She has not had any similar symptoms in the past. She has had some diffuse body aches with this. She's been eating and drinking as usual and has been taking her insulin as she is supposed to. She does report that she has had some recent yeast infections.  Past Medical History  Diagnosis Date  . Diabetes mellitus     since age 23  . Hyperthyroidism   . Costochondritis    Past Surgical History  Procedure Laterality Date  . Appendectomy     Family History  Problem Relation Age of Onset  . Diabetes Other     Grandparent  . Stroke Other     Grandparent  . Hypertension Other     Grandparent   History  Substance Use Topics  . Smoking status: Never Smoker   . Smokeless tobacco: Never Used  . Alcohol Use: No   OB History    Gravida Para Term Preterm AB TAB SAB Ectopic Multiple Living   0              Review of Systems  All other systems reviewed and are negative.     Allergies  Review of patient's allergies indicates no known allergies.  Home Medications   Prior to Admission medications   Medication Sig Start Date End Date Taking? Authorizing Provider  acetaminophen (TYLENOL) 500 MG tablet Take 1,000 mg by mouth every 6 (six) hours as needed for moderate pain.   Yes Historical Provider, MD  fluconazole (DIFLUCAN) 100 MG tablet 2  tablets today and repeat in 3 days and 1 as needed Patient taking differently: Take 100 mg by mouth as needed (FOR INFECTION). 2 tablets today and repeat in 3 days and 1 as needed 09/23/14  Yes Harrington ChallengerNancy J Young, NP  insulin glargine (LANTUS) 100 UNIT/ML injection Inject 46 Units into the skin every evening.    Yes Historical Provider, MD  insulin lispro (HUMALOG) 100 UNIT/ML injection Inject 0-13 Units into the skin 3 (three) times daily before meals.   Yes Historical Provider, MD  miconazole (MICOTIN) 100 MG vaginal suppository Place 100 mg vaginally as needed (FOR INFECTION).   Yes Historical Provider, MD  Multiple Vitamin (MULTIVITAMIN WITH MINERALS) TABS tablet Take 1 tablet by mouth daily.   Yes Historical Provider, MD  norgestimate-ethinyl estradiol (ORTHO-CYCLEN,SPRINTEC,PREVIFEM) 0.25-35 MG-MCG tablet Take 1 tablet by mouth daily. 03/31/14  Yes Ok EdwardsJuan H Fernandez, MD  SUMAtriptan (IMITREX) 100 MG tablet Take 1 tablet (100 mg total) by mouth every 2 (two) hours as needed for migraine or headache. May repeat in 2 hours if headache persists or recurs. 08/12/14  Yes Tishira R Brewington, PA-C  glucose blood (BAYER CONTOUR TEST) test strip 1 each by Other route 4 (four) times daily. And lancets 4/day 250.01 06/16/13   Romero BellingSean Ellison, MD  insulin lispro (HUMALOG) 100 UNIT/ML injection For  use in pump, total of 75 units per day 01/28/13   Romero Belling, MD  nystatin-triamcinolone ointment [facility]) Apply 1 application topically 2 (two) times daily. 09/23/14   Harrington Challenger, NP   BP 131/81 mmHg  Pulse 86  Temp(Src) 99.2 F (37.3 C) (Oral)  Resp 19  Ht 6' (1.829 m)  Wt 162 lb (73.483 kg)  BMI 21.97 kg/m2  SpO2 100%  LMP  Physical Exam  Constitutional: She is oriented to person, place, and time. She appears well-developed and well-nourished.  HENT:  Head: Normocephalic and atraumatic.  Right Ear: External ear normal.  Left Ear: External ear normal.  Nose: Nose normal.  Mouth/Throat: Oropharynx is clear  and moist.  Eyes: Conjunctivae and EOM are normal. Pupils are equal, round, and reactive to light.  Neck: Normal range of motion. Neck supple.  Cardiovascular: Normal rate, regular rhythm, normal heart sounds and intact distal pulses.   Pulmonary/Chest: Effort normal and breath sounds normal.  Abdominal: Soft. Bowel sounds are normal. There is tenderness.    Musculoskeletal: Normal range of motion.  Neurological: She is alert and oriented to person, place, and time. She has normal reflexes.  Skin: Skin is warm and dry.  Psychiatric: She has a normal mood and affect. Her behavior is normal. Judgment and thought content normal.  Nursing note and vitals reviewed.   ED Course  Procedures (including critical care time) Labs Review Labs Reviewed  COMPREHENSIVE METABOLIC PANEL - Abnormal; Notable for the following:    Sodium 136 (*)    Glucose, Bld 182 (*)    Total Bilirubin <0.2 (*)    All other components within normal limits  CBC WITH DIFFERENTIAL - Abnormal; Notable for the following:    WBC 3.5 (*)    Neutro Abs 1.5 (*)    All other components within normal limits  URINALYSIS, ROUTINE W REFLEX MICROSCOPIC - Abnormal; Notable for the following:    APPearance CLOUDY (*)    Specific Gravity, Urine 1.037 (*)    Glucose, UA >1000 (*)    Leukocytes, UA TRACE (*)    All other components within normal limits  URINE MICROSCOPIC-ADD ON - Abnormal; Notable for the following:    Squamous Epithelial / LPF MANY (*)    Bacteria, UA FEW (*)    All other components within normal limits  LIPASE, BLOOD  PREGNANCY, URINE    Imaging Review No results found.   EKG Interpretation   Date/Time:  Thursday October 15 2014 20:25:00 EST Ventricular Rate:  85 PR Interval:  150 QRS Duration: 88 QT Interval:  328 QTC Calculation: 390 R Axis:   81 Text Interpretation:  Normal sinus rhythm Nonspecific ST abnormality  Anterior leads Confirmed by Noura Purpura MD, Duwayne Heck [phone]) on 10/15/2014 8:29:56   PM      MDM   Final diagnoses:  Chest pain   Results for orders placed or performed during the hospital encounter of 10/15/14  Comprehensive metabolic panel  Result Value Ref Range   Sodium 136 (L) 137 - 147 mEq/L   Potassium 4.3 3.7 - 5.3 mEq/L   Chloride 99 96 - 112 mEq/L   CO2 26 19 - 32 mEq/L   Glucose, Bld 182 (H) 70 - 99 mg/dL   BUN 11 6 - 23 mg/dL   Creatinine, Ser 6.04 0.50 - 1.10 mg/dL   Calcium 9.9 8.4 - 54.0 mg/dL   Total Protein 7.4 6.0 - 8.3 g/dL   Albumin 3.7 3.5 - 5.2 g/dL   AST 29  0 - 37 U/L   ALT 20 0 - 35 U/L   Alkaline Phosphatase 83 39 - 117 U/L   Total Bilirubin <0.2 (L) 0.3 - 1.2 mg/dL   GFR calc non Af Amer >90 >90 mL/min   GFR calc Af Amer >90 >90 mL/min   Anion gap 11 5 - 15  Lipase, blood  Result Value Ref Range   Lipase 31 11 - 59 U/L  CBC with Differential  Result Value Ref Range   WBC 3.5 (L) 4.0 - 10.5 K/uL   RBC 4.60 3.87 - 5.11 MIL/uL   Hemoglobin 13.0 12.0 - 15.0 g/dL   HCT 08.637.5 57.836.0 - 46.946.0 %   MCV 81.5 78.0 - 100.0 fL   MCH 28.3 26.0 - 34.0 pg   MCHC 34.7 30.0 - 36.0 g/dL   RDW 62.911.7 52.811.5 - 41.315.5 %   Platelets 199 150 - 400 K/uL   Neutrophils Relative % 44 43 - 77 %   Neutro Abs 1.5 (L) 1.7 - 7.7 K/uL   Lymphocytes Relative 44 12 - 46 %   Lymphs Abs 1.6 0.7 - 4.0 K/uL   Monocytes Relative 10 3 - 12 %   Monocytes Absolute 0.4 0.1 - 1.0 K/uL   Eosinophils Relative 1 0 - 5 %   Eosinophils Absolute 0.0 0.0 - 0.7 K/uL   Basophils Relative 1 0 - 1 %   Basophils Absolute 0.0 0.0 - 0.1 K/uL  Pregnancy, urine  Result Value Ref Range   Preg Test, Ur NEGATIVE NEGATIVE  Urinalysis, Routine w reflex microscopic  Result Value Ref Range   Color, Urine YELLOW YELLOW   APPearance CLOUDY (A) CLEAR   Specific Gravity, Urine 1.037 (H) 1.005 - 1.030   pH 7.0 5.0 - 8.0   Glucose, UA >1000 (A) NEGATIVE mg/dL   Hgb urine dipstick NEGATIVE NEGATIVE   Bilirubin Urine NEGATIVE NEGATIVE   Ketones, ur NEGATIVE NEGATIVE mg/dL   Protein, ur  NEGATIVE NEGATIVE mg/dL   Urobilinogen, UA 1.0 0.0 - 1.0 mg/dL   Nitrite NEGATIVE NEGATIVE   Leukocytes, UA TRACE (A) NEGATIVE  Urine microscopic-add on  Result Value Ref Range   Squamous Epithelial / LPF MANY (A) RARE   WBC, UA TOO NUMEROUS TO COUNT <3 WBC/hpf   RBC / HPF 0-2 <3 RBC/hpf   Bacteria, UA FEW (A) RARE   18 y.o. Female with generalized weakness and not feeling well today.  Exam signficant for some diffeuse right sided abdominal ttp.. Work up c.w. Ut.  Patient No other finding on CT pending.  Patient denies vaginal discharge or std.  Discussed findings with Dr Rhunette CroftNanavati and he will d/c patientpnt if ct negative.  Discussed plan with patient and mother and they voice understanding of return prcautions and need for close follow up.       Hilario Quarryanielle S Willadeen Colantuono, MD 10/16/14 [phone]

## 2014-10-16 LAB — CBG MONITORING, ED: Glucose-Capillary: 195 mg/dL — ABNORMAL HIGH (ref 70–99)

## 2014-10-16 MED ORDER — CEPHALEXIN 500 MG PO CAPS: 500.0000 mg | ORAL_CAPSULE | Freq: Four times a day (QID) | ORAL | Status: AC

## 2014-10-16 MED ORDER — ONDANSETRON 8 MG PO TBDP: 8.0000 mg | ORAL_TABLET | Freq: Three times a day (TID) | ORAL | Status: AC | PRN

## 2014-10-16 MED ORDER — TRAMADOL HCL 50 MG PO TABS: 50.0000 mg | ORAL_TABLET | Freq: Four times a day (QID) | ORAL | Status: AC | PRN

## 2014-10-16 NOTE — ED Provider Notes (Signed)
Assuming care of patient from Dr. Rosalia Hammersay. Patient in the ED for abd pain and some intermediate non specific chest pain. Pt had complete evaluation by the primary team, and i was asked to f/u on the CT scan of the abd. Pt has diffuse abd tenderness to palpation. CT scan shows CYSTITIS. Results discussed w/ the pt, and she continues to have the same generalized pain, with no peritoneal signs. Labs show UTI - and with her having flank pain we will clinically call this a pyelonephritis. Pt has no GU complains.  Return precautions discussed.   Derwood KaplanAnkit Dahlton Hinde, MD 10/16/14 [phone]

## 2014-10-16 NOTE — Discharge Instructions (Signed)
The lab and imaging result here shows infection of the urinary tract. Take the antibiotics as prescribed, and see the primary care doctor as planned. Please return to the ER if your symptoms worsen; you have increased pain, fevers, chills, inability to keep any medications down, confusion. Otherwise see the outpatient doctor as requested.  Pyelonephritis, Adult Pyelonephritis is a kidney infection. In general, there are 2 main types of pyelonephritis:  Infections that come on quickly without any warning (acute pyelonephritis).  Infections that persist for a long period of time (chronic pyelonephritis). CAUSES  Two main causes of pyelonephritis are:  Bacteria traveling from the bladder to the kidney. This is a problem especially in pregnant women. The urine in the bladder can become filled with bacteria from multiple causes, including:  Inflammation of the prostate gland (prostatitis).  Sexual intercourse in females.  Bladder infection (cystitis).  Bacteria traveling from the bloodstream to the tissue part of the kidney. Problems that may increase your risk of getting a kidney infection include:  Diabetes.  Kidney stones or bladder stones.  Cancer.  Catheters placed in the bladder.  Other abnormalities of the kidney or ureter. SYMPTOMS   Abdominal pain.  Pain in the side or flank area.  Fever.  Chills.  Upset stomach.  Blood in the urine (dark urine).  Frequent urination.  Strong or persistent urge to urinate.  Burning or stinging when urinating. DIAGNOSIS  Your caregiver may diagnose your kidney infection based on your symptoms. A urine sample may also be taken. TREATMENT  In general, treatment depends on how severe the infection is.   If the infection is mild and caught early, your caregiver may treat you with oral antibiotics and send you home.  If the infection is more severe, the bacteria may have gotten into the bloodstream. This will require  intravenous (IV) antibiotics and a hospital stay. Symptoms may include:  High fever.  Severe flank pain.  Shaking chills.  Even after a hospital stay, your caregiver may require you to be on oral antibiotics for a period of time.  Other treatments may be required depending upon the cause of the infection. HOME CARE INSTRUCTIONS   Take your antibiotics as directed. Finish them even if you start to feel better.  Make an appointment to have your urine checked to make sure the infection is gone.  Drink enough fluids to keep your urine clear or pale yellow.  Take medicines for the bladder if you have urgency and frequency of urination as directed by your caregiver. SEEK IMMEDIATE MEDICAL CARE IF:   You have a fever or persistent symptoms for more than 2-3 days.  You have a fever and your symptoms suddenly get worse.  You are unable to take your antibiotics or fluids.  You develop shaking chills.  You experience extreme weakness or fainting.  There is no improvement after 2 days of treatment. MAKE SURE YOU:  Understand these instructions.  Will watch your condition.  Will get help right away if you are not doing well or get worse. Document Released: 10/16/2005 Document Revised: 04/16/2012 Document Reviewed: 03/22/2011 [facility] Patient Information 2015 [place], [place]. This information is not intended to replace advice given to you by your health care provider. Make sure you discuss any questions you have with your health care provider.

## 2014-10-17 LAB — URINE CULTURE

## 2014-10-18 ENCOUNTER — Encounter (HOSPITAL_COMMUNITY): Payer: Self-pay | Admitting: *Deleted

## 2014-10-18 ENCOUNTER — Emergency Department (HOSPITAL_COMMUNITY): Payer: 59

## 2014-10-18 ENCOUNTER — Emergency Department (HOSPITAL_COMMUNITY)
Admission: EM | Admit: 2014-10-18 | Discharge: 2014-10-18 | Disposition: A | Discharge status: Home / Self Care | Payer: 59 | Attending: Emergency Medicine | Admitting: Emergency Medicine

## 2014-10-18 DIAGNOSIS — Z79899 Other long term (current) drug therapy: Secondary | ICD-10-CM | POA: Insufficient documentation

## 2014-10-18 DIAGNOSIS — Z8739 Personal history of other diseases of the musculoskeletal system and connective tissue: Secondary | ICD-10-CM | POA: Diagnosis not present

## 2014-10-18 DIAGNOSIS — Z794 Long term (current) use of insulin: Secondary | ICD-10-CM | POA: Insufficient documentation

## 2014-10-18 DIAGNOSIS — R079 Chest pain, unspecified: Secondary | ICD-10-CM | POA: Diagnosis present

## 2014-10-18 DIAGNOSIS — Z791 Long term (current) use of non-steroidal anti-inflammatories (NSAID): Secondary | ICD-10-CM | POA: Insufficient documentation

## 2014-10-18 DIAGNOSIS — Z8639 Personal history of other endocrine, nutritional and metabolic disease: Secondary | ICD-10-CM | POA: Diagnosis not present

## 2014-10-18 DIAGNOSIS — E119 Type 2 diabetes mellitus without complications: Secondary | ICD-10-CM | POA: Insufficient documentation

## 2014-10-18 DIAGNOSIS — Z792 Long term (current) use of antibiotics: Secondary | ICD-10-CM | POA: Diagnosis not present

## 2014-10-18 LAB — BASIC METABOLIC PANEL
ANION GAP: 13 (ref 5–15)
BUN: 13 mg/dL (ref 6–23)
CALCIUM: 9.5 mg/dL (ref 8.4–10.5)
CO2: 25 meq/L (ref 19–32)
CREATININE: 0.99 mg/dL (ref 0.50–1.10)
Chloride: 97 mEq/L (ref 96–112)
GFR calc Af Amer: >90 mL/min (ref >90)
GFR calc non Af Amer: 82 mL/min — ABNORMAL LOW (ref >90)
Glucose, Bld: 378 mg/dL — ABNORMAL HIGH (ref 70–99)
Potassium: 4.6 mEq/L (ref 3.7–5.3)
Sodium: 135 mEq/L — ABNORMAL LOW (ref 137–147)

## 2014-10-18 LAB — CBC WITH DIFFERENTIAL/PLATELET
BASOS PCT: 3 % — AB (ref 0–1)
Basophils Absolute: 0.1 10*3/uL (ref 0.0–0.1)
EOS PCT: 0 % (ref 0–5)
Eosinophils Absolute: 0 10*3/uL (ref 0.0–0.7)
HEMATOCRIT: 38.3 % (ref 36.0–46.0)
HEMOGLOBIN: 13.5 g/dL (ref 12.0–15.0)
LYMPHS ABS: 2.1 10*3/uL (ref 0.7–4.0)
Lymphocytes Relative: 64 % — ABNORMAL HIGH (ref 12–46)
MCH: 29 pg (ref 26.0–34.0)
MCHC: 35.2 g/dL (ref 30.0–36.0)
MCV: 82.4 fL (ref 78.0–100.0)
MONO ABS: 0.3 10*3/uL (ref 0.1–1.0)
MONOS PCT: 10 % (ref 3–12)
Neutro Abs: 0.7 10*3/uL — ABNORMAL LOW (ref 1.7–7.7)
Neutrophils Relative %: 23 % — ABNORMAL LOW (ref 43–77)
Platelets: 183 10*3/uL (ref 150–400)
RBC: 4.65 MIL/uL (ref 3.87–5.11)
RDW: 11.8 % (ref 11.5–15.5)
WBC: 3.2 10*3/uL — AB (ref 4.0–10.5)

## 2014-10-18 LAB — I-STAT TROPONIN, ED: TROPONIN I, POC: 0 ng/mL (ref 0.00–0.08)

## 2014-10-18 LAB — D-DIMER, QUANTITATIVE (NOT AT ARMC): D DIMER QUANT: 0.73 ug{FEU}/mL — AB (ref 0.00–0.48)

## 2014-10-18 LAB — CBG MONITORING, ED: Glucose-Capillary: 343 mg/dL — ABNORMAL HIGH (ref 70–99)

## 2014-10-18 MED ORDER — IOHEXOL 350 MG/ML SOLN
100.0000 mL | Freq: Once | INTRAVENOUS | Status: AC | PRN
  Administered 2014-10-18: 100 mL via INTRAVENOUS

## 2014-10-18 MED ORDER — NAPROXEN 500 MG PO TABS: 500.0000 mg | ORAL_TABLET | Freq: Two times a day (BID) | ORAL | Status: AC

## 2014-10-18 MED ORDER — KETOROLAC TROMETHAMINE 30 MG/ML IJ SOLN
30.0000 mg | Freq: Once | INTRAMUSCULAR | Status: AC
  Administered 2014-10-18: 30 mg via INTRAVENOUS
  Filled 2014-10-18: qty 1

## 2014-10-18 NOTE — Discharge Instructions (Signed)

## 2014-10-18 NOTE — ED Notes (Signed)
Pt A&OX4, ambulatory at d/c with steady gait, NAD 

## 2014-10-18 NOTE — ED Notes (Signed)
Pt reports being seen here on Thursday for same. Having right side sharp intermittent chest pains. Denies recent cough.

## 2014-10-20 NOTE — ED Provider Notes (Signed)
CSN: [id number]     Arrival date & time 10/18/14  1826 History   First MD Initiated Contact with Patient 10/18/14 1845     Chief Complaint  Patient presents with  . Chest Pain     (Consider location/radiation/quality/duration/timing/severity/associated sxs/prior Treatment) Patient is a 18 y.o. female presenting with chest pain. The history is provided by the patient. No language interpreter was used.  Chest Pain Pain location:  R lateral chest Pain quality: sharp   Pain radiates to:  Does not radiate Pain radiates to the back: no   Onset quality:  Sudden Duration:  1 week Timing:  Intermittent Progression:  Waxing and waning Chronicity:  New Context: at rest   Relieved by:  Nothing Worsened by:  Nothing tried Ineffective treatments:  None tried Associated symptoms: no abdominal pain, no back pain, no cough, no diaphoresis, no fatigue, no fever, no headache, no nausea, no numbness, no palpitations, no shortness of breath, not vomiting and no weakness   Risk factors: birth control and diabetes mellitus   Risk factors: no coronary artery disease, no high cholesterol, no hypertension, no immobilization, not obese, not pregnant, no prior DVT/PE, no smoking and no surgery     Past Medical History  Diagnosis Date  . Diabetes mellitus     since age 33  . Hyperthyroidism   . Costochondritis    Past Surgical History  Procedure Laterality Date  . Appendectomy     Family History  Problem Relation Age of Onset  . Diabetes Other     Grandparent  . Stroke Other     Grandparent  . Hypertension Other     Grandparent   History  Substance Use Topics  . Smoking status: Never Smoker   . Smokeless tobacco: Never Used  . Alcohol Use: No   OB History    Gravida Para Term Preterm AB TAB SAB Ectopic Multiple Living   0              Review of Systems  Constitutional: Negative for fever, chills, diaphoresis, activity change, appetite change and fatigue.  HENT: Negative for  congestion, facial swelling, rhinorrhea and sore throat.   Eyes: Negative for photophobia and discharge.  Respiratory: Negative for cough, chest tightness and shortness of breath.   Cardiovascular: Positive for chest pain. Negative for palpitations and leg swelling.  Gastrointestinal: Negative for nausea, vomiting, abdominal pain and diarrhea.  Endocrine: Negative for polydipsia and polyuria.  Genitourinary: Negative for dysuria, frequency, difficulty urinating and pelvic pain.  Musculoskeletal: Negative for back pain, arthralgias, neck pain and neck stiffness.  Skin: Negative for color change and wound.  Allergic/Immunologic: Negative for immunocompromised state.  Neurological: Negative for facial asymmetry, weakness, numbness and headaches.  Hematological: Does not bruise/bleed easily.  Psychiatric/Behavioral: Negative for confusion and agitation.      Allergies  Review of patient's allergies indicates no known allergies.  Home Medications   Prior to Admission medications   Medication Sig Start Date End Date Taking? Authorizing Provider  acetaminophen (TYLENOL) 325 MG tablet Take 975 mg by mouth daily as needed (pain).   Yes Historical Provider, MD  Insulin Glargine (LANTUS SOLOSTAR) 100 UNIT/ML Solostar Pen Inject 42 Units into the skin every evening. 5:30pm   Yes Historical Provider, MD  insulin lispro (HUMALOG KWIKPEN) 100 UNIT/ML KiwkPen Inject 2-14 Units into the skin 3 (three) times daily as needed (high blood sugar/ CBG over 200 per sliding scale).   Yes Historical Provider, MD  Multiple Vitamin (MULTIVITAMIN WITH MINERALS)  TABS tablet Take 1 tablet by mouth daily.   Yes Historical Provider, MD  norgestimate-ethinyl estradiol (ORTHO-CYCLEN,SPRINTEC,PREVIFEM) 0.25-35 MG-MCG tablet Take 1 tablet by mouth daily. Patient taking differently: Take 1 tablet by mouth every evening. 5:30pm 03/31/14  Yes Ok Edwards, MD  SUMAtriptan (IMITREX) 100 MG tablet Take 1 tablet (100 mg total)  by mouth every 2 (two) hours as needed for migraine or headache. May repeat in 2 hours if headache persists or recurs. 08/12/14  Yes Tishira R Brewington, PA-C  cephALEXin (KEFLEX) 500 MG capsule Take 1 capsule (500 mg total) by mouth 4 (four) times daily. 10/16/14   Derwood Kaplan, MD  fluconazole (DIFLUCAN) 100 MG tablet 2 tablets today and repeat in 3 days and 1 as needed Patient not taking: Reported on 10/18/2014 09/23/14   Harrington Challenger, NP  glucose blood (BAYER CONTOUR TEST) test strip 1 each by Other route 4 (four) times daily. And lancets 4/day 250.01 06/16/13   Romero Belling, MD  insulin lispro (HUMALOG) 100 UNIT/ML injection For use in pump, total of 75 units per day Patient not taking: Reported on 10/18/2014 01/28/13   Romero Belling, MD  naproxen (NAPROSYN) 500 MG tablet Take 1 tablet (500 mg total) by mouth 2 (two) times daily. 10/18/14   Toy Cookey, MD  nystatin-triamcinolone ointment (MYCOLOG) Apply 1 application topically 2 (two) times daily. Patient not taking: Reported on 10/18/2014 09/23/14   Harrington Challenger, NP  ondansetron (ZOFRAN ODT) 8 MG disintegrating tablet Take 1 tablet (8 mg total) by mouth every 8 (eight) hours as needed for nausea. 10/16/14   Derwood Kaplan, MD  traMADol (ULTRAM) 50 MG tablet Take 1 tablet (50 mg total) by mouth every 6 (six) hours as needed. Patient taking differently: Take 50 mg by mouth every 6 (six) hours as needed (pain).  10/16/14   Ankit Rhunette Croft, MD   BP 114/75 mmHg  Pulse 73  Temp(Src) 98.3 F (36.8 C) (Oral)  Resp 22  SpO2 99% Physical Exam  Constitutional: She is oriented to person, place, and time. She appears well-developed and well-nourished. No distress.  HENT:  Head: Normocephalic and atraumatic.  Mouth/Throat: No oropharyngeal exudate.  Eyes: Pupils are equal, round, and reactive to light.  Neck: Normal range of motion. Neck supple.  Cardiovascular: Normal rate, regular rhythm and normal heart sounds.  Exam reveals no gallop and  no friction rub.   No murmur heard. Pulmonary/Chest: Effort normal and breath sounds normal. No respiratory distress. She has no wheezes. She has no rales.  Abdominal: Soft. Bowel sounds are normal. She exhibits no distension and no mass. There is no tenderness. There is no rebound and no guarding.  Musculoskeletal: Normal range of motion. She exhibits no edema or tenderness.  Neurological: She is alert and oriented to person, place, and time.  Skin: Skin is warm and dry.  Psychiatric: She has a normal mood and affect.    ED Course  Procedures (including critical care time) Labs Review Labs Reviewed  CBC WITH DIFFERENTIAL - Abnormal; Notable for the following:    WBC 3.2 (*)    Neutrophils Relative % 23 (*)    Lymphocytes Relative 64 (*)    Basophils Relative 3 (*)    Neutro Abs 0.7 (*)    All other components within normal limits  BASIC METABOLIC PANEL - Abnormal; Notable for the following:    Sodium 135 (*)    Glucose, Bld 378 (*)    GFR calc non Af Amer 82 (*)  All other components within normal limits  D-DIMER, QUANTITATIVE - Abnormal; Notable for the following:    D-Dimer, Quant 0.73 (*)    All other components within normal limits  CBG MONITORING, ED - Abnormal; Notable for the following:    Glucose-Capillary 343 (*)    All other components within normal limits  I-STAT TROPOININ, ED    Imaging Review Dg Chest 2 View  10/18/2014   CLINICAL DATA:  Chest pain on the right for 3 days. Subsequent encounter.  EXAM: CHEST  2 VIEW  COMPARISON:  PA lateral chest 10/15/2014.  FINDINGS: Heart size and mediastinal contours are within normal limits. Both lungs are clear. Visualized skeletal structures are unremarkable.  IMPRESSION: Normal exam.   Electronically Signed   By: Drusilla Kanner M.D.   On: 10/18/2014 20:06   Ct Angio Chest Pe W/cm &/or Wo Cm  10/18/2014   CLINICAL DATA:  Acute onset of sharp right-sided intermittent chest pain. Initial encounter.  EXAM: CT ANGIOGRAPHY  CHEST WITH CONTRAST  TECHNIQUE: Multidetector CT imaging of the chest was performed using the standard protocol during bolus administration of intravenous contrast. Multiplanar CT image reconstructions and MIPs were obtained to evaluate the vascular anatomy.  CONTRAST:  OMNIPAQUE IOHEXOL 350 MG/ML SOLN  COMPARISON:  Chest radiograph performed earlier today at 7:56 p.m.  FINDINGS: There is no evidence of pulmonary embolus.  A few scattered tiny hazy nodular opacities are seen in the periphery of the left lung. Given the patient's age, this may reflect remote sequelae of an infectious process. There is no evidence of significant focal consolidation, pleural effusion or pneumothorax. No masses are identified; no abnormal focal contrast enhancement is seen.  The mediastinum is unremarkable in appearance. No mediastinal lymphadenopathy is seen. No pericardial effusion is identified. The great vessels are grossly unremarkable in appearance. No axillary lymphadenopathy is seen. The visualized portions of the thyroid gland are unremarkable in appearance.  The visualized portions of the liver and spleen are unremarkable.  No acute osseous abnormalities are seen.  Review of the MIP images confirms the above findings.  IMPRESSION: 1. No evidence of pulmonary embolus. 2. Few tiny hazy nodular opacities in the periphery of the left lung may reflect remote sequelae of an infectious process. No suspicious nodules seen. Lungs otherwise clear.   Electronically Signed   By: Roanna Raider M.D.   On: 10/18/2014 21:24     EKG Interpretation   Date/Time:  Sunday October 18 2014 18:33:56 EST Ventricular Rate:  73 PR Interval:  132 QRS Duration: 78 QT Interval:  362 QTC Calculation: 398 R Axis:   88 Text Interpretation:  Normal sinus rhythm Normal ECG Confirmed by DOCHERTY   MD, MEGAN ([id number]) on 10/18/2014 6:45:40 PM      MDM   Final diagnoses:  Right-sided chest pain    Pt is a 18 y.o. female with Pmhx as  above who presents with intermittent R sided, sharp CP. Seen 12/18, was felt to be abdominal pain, had negative CT. She returns today with continues symptoms, denies SOB, leg pain, fevers.   Cannot use PERC rule given OCP, d-dimer mildly elevated. CTA negative for PE. HPI not c/w ACS and has no risk factors for such except for DM. Will rec course of scheduled NSAIDs.      Khyli D Griego evaluation in the Emergency Department is complete. It has been determined that no acute conditions requiring further emergency intervention are present at this time. The patient/guardian have been advised of  the diagnosis and plan. We have discussed signs and symptoms that warrant return to the ED, such as changes or worsening in symptoms, worsening pain, fever, SOB      Toy CookeyMegan Docherty, MD 10/20/14 1847

## 2015-08-26 IMAGING — CT CT ABD-PELV W/ CM
2 of 4 series · 11 of 46 positions shown, 12 images · IV contrast (Iodine)
Comparison: Abdominal radiograph May 12, 2014

CLINICAL DATA: RIGHT side abdominal pain with nausea beginning this
afternoon.

EXAM:
CT ABDOMEN AND PELVIS WITH CONTRAST
TECHNIQUE: Multidetector CT imaging of the abdomen and pelvis was performed
using the standard protocol following bolus administration of
intravenous contrast.
CONTRAST:  100mL OMNIPAQUE IOHEXOL 300 MG/ML  SOLN

[Series 201: routine, idose (2) · axial · 0.65mm/px · z∈[+52,+477]mm · 8 of 103 slices shown, 9 images]
[im 9/103  soft-tissue]
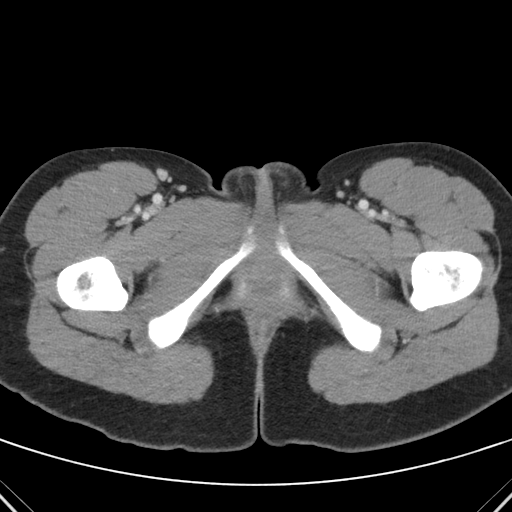
[im 9/103  bone]
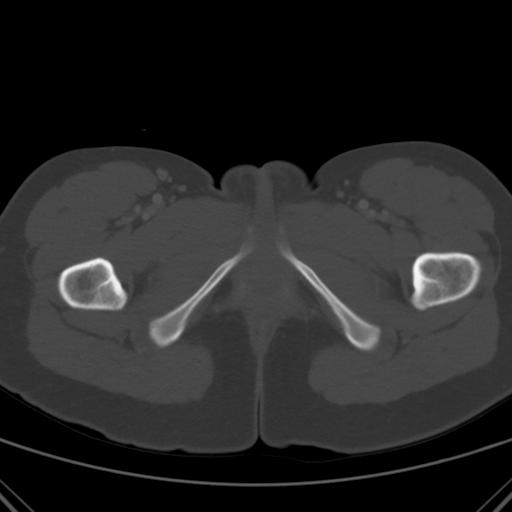
[im 22/103  soft-tissue]
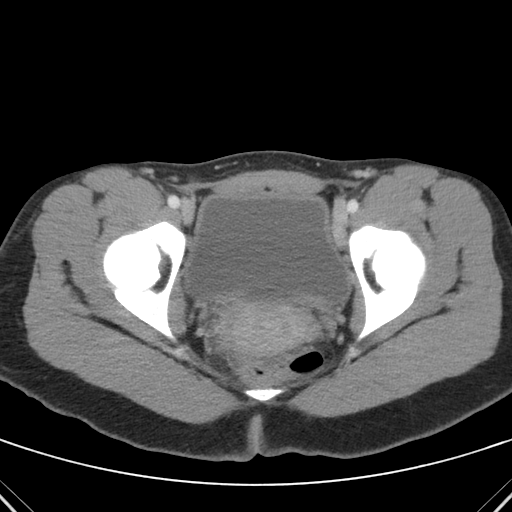
[im 35/103  soft-tissue]
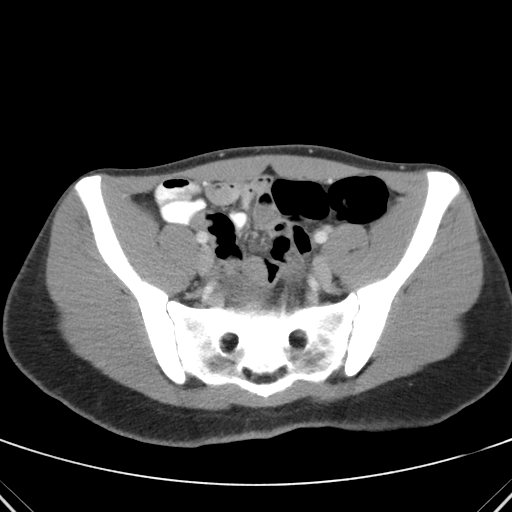
[im 47/103  soft-tissue]
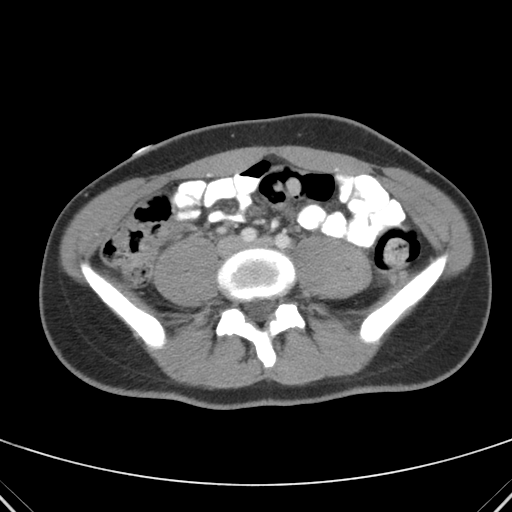
[im 56/103  soft-tissue]
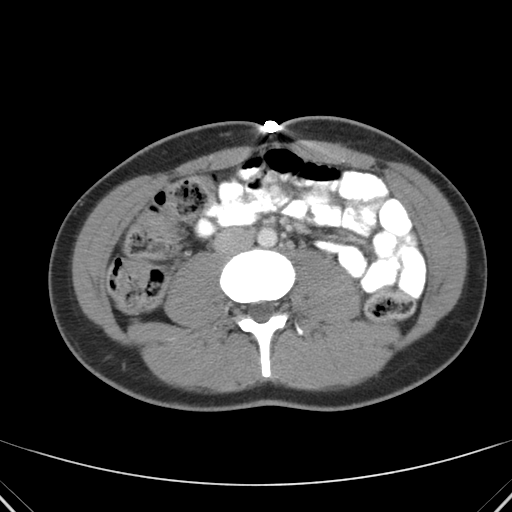
[im 69/103  soft-tissue]
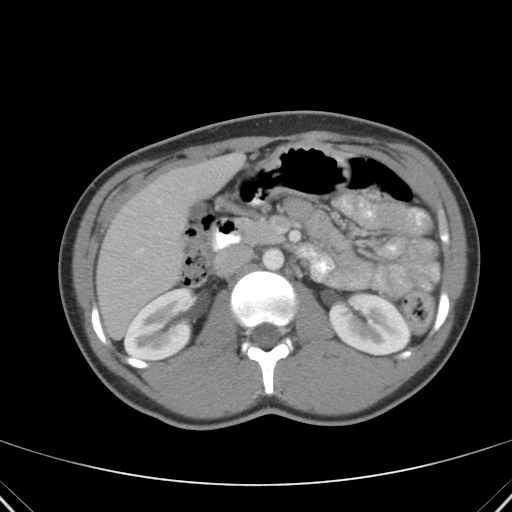
[im 81/103  soft-tissue]
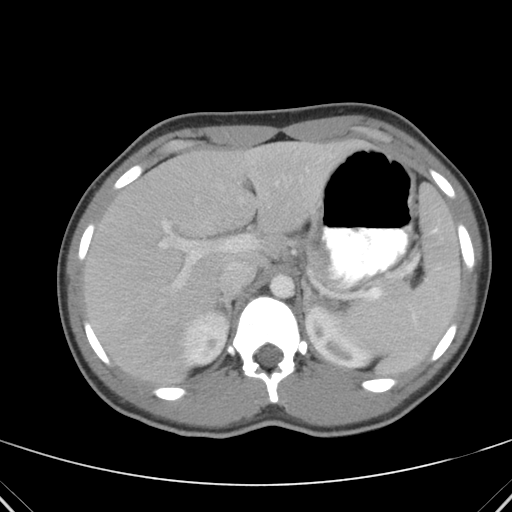
[im 94/103  soft-tissue]
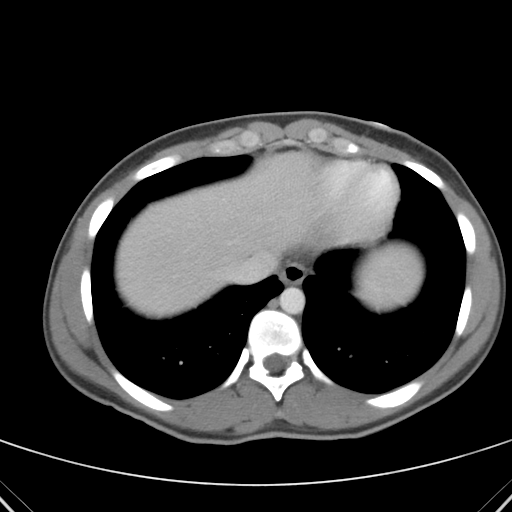

[Series 202: coronals, idose (2) · coronal · 0.45mm/px · 3 of 90 slices shown]
[im 30/90  soft-tissue]
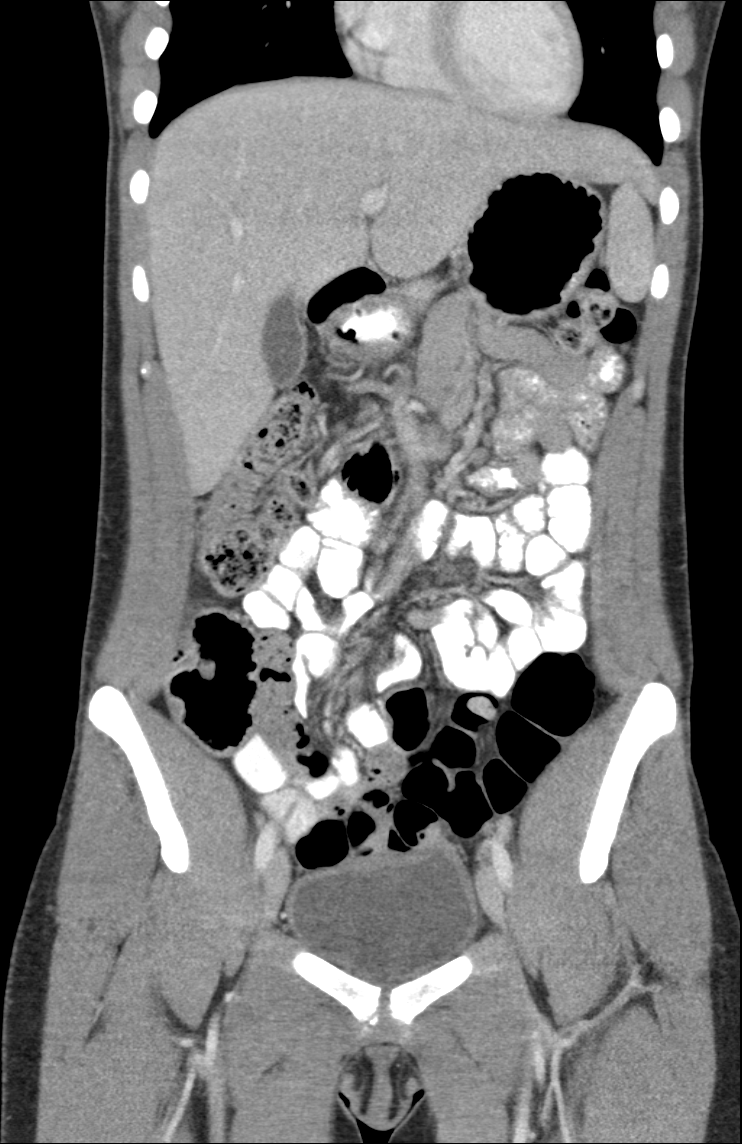
[im 40/90  soft-tissue]
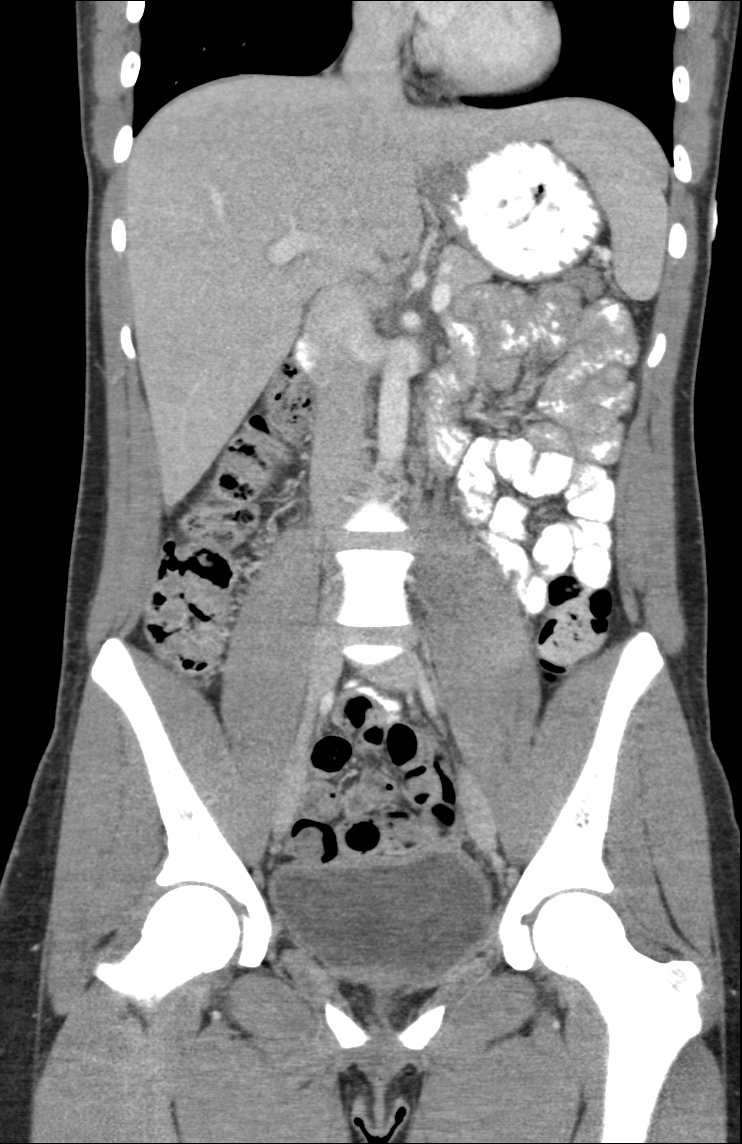
[im 50/90  soft-tissue]
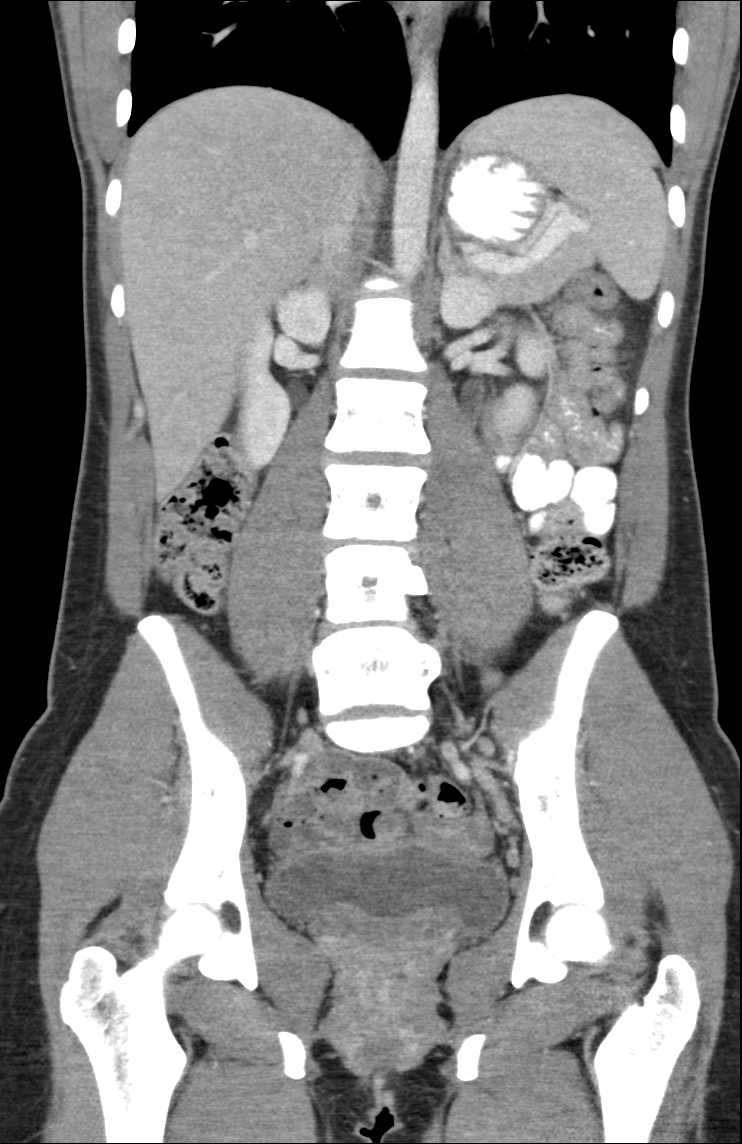

[11 of 46 positions shown; findings below may reference images not displayed]

FINDINGS: LUNG BASES: Included view of the lung bases are clear. Visualized
heart and pericardium are unremarkable.

SOLID ORGANS: The liver, spleen, gallbladder, pancreas and adrenal
glands are unremarkable.

GASTROINTESTINAL TRACT: The stomach, small and large bowel are
normal in course and caliber without inflammatory changes. Enteric
contrast has not yet reached the distal small bowel. Normal
appendix.

KIDNEYS/ URINARY TRACT: Kidneys are orthotopic, demonstrating
symmetric enhancement. No nephrolithiasis, hydronephrosis or solid
renal masses. The unopacified ureters are normal in course and
caliber. Delayed imaging through the kidneys demonstrates symmetric
prompt contrast excretion within the proximal urinary collecting
system. Urinary bladder is well distended, with mild urinary bladder
wall thickening, greater than expected for degree of distension.

PERITONEUM/RETROPERITONEUM: Small amount of free fluid in the pelvis
is likely physiologic. No intraperitoneal free air. Aortoiliac
vessels are normal in course and caliber. Inguinal lymphadenopathy
with reniform morphology is likely reactive. Internal reproductive
organs are unremarkable.

SOFT TISSUE/OSSEOUS STRUCTURES: Nonsuspicious.
IMPRESSION: Urinary bladder wall thickening can be seen with cystitis. No CT
findings of pyelonephritis.

Normal appendix.

  By: Nikhita Milu

## 2017-03-14 ENCOUNTER — Encounter: Payer: Self-pay | Admitting: Gynecology
